# Patient Record
Sex: Female | Born: 1980 | Race: Black or African American | Hispanic: No | Marital: Single | Smoking: Never smoker
Health system: Southern US, Community
[De-identification: ages and names within clinical notes are randomized; demographics above are authoritative.]

## PROBLEM LIST (undated history)

## (undated) ENCOUNTER — Inpatient Hospital Stay (HOSPITAL_COMMUNITY): Payer: Self-pay

## (undated) DIAGNOSIS — I10 Essential (primary) hypertension: Secondary | ICD-10-CM

## (undated) DIAGNOSIS — D649 Anemia, unspecified: Secondary | ICD-10-CM

## (undated) DIAGNOSIS — E282 Polycystic ovarian syndrome: Secondary | ICD-10-CM

## (undated) DIAGNOSIS — Z8759 Personal history of other complications of pregnancy, childbirth and the puerperium: Secondary | ICD-10-CM

---

## 1999-03-27 ENCOUNTER — Emergency Department (HOSPITAL_COMMUNITY): Admission: EM | Admit: 1999-03-27 | Discharge: 1999-03-27 | Payer: Self-pay | Admitting: Emergency Medicine

## 1999-05-04 ENCOUNTER — Encounter: Admission: RE | Admit: 1999-05-04 | Discharge: 1999-05-04 | Payer: Self-pay | Admitting: Obstetrics

## 1999-06-20 ENCOUNTER — Encounter: Admission: RE | Admit: 1999-06-20 | Discharge: 1999-06-20 | Payer: Self-pay | Admitting: Obstetrics & Gynecology

## 1999-08-01 ENCOUNTER — Encounter: Admission: RE | Admit: 1999-08-01 | Discharge: 1999-08-01 | Payer: Self-pay | Admitting: Internal Medicine

## 2001-10-08 HISTORY — PX: LAPAROSCOPIC CHOLECYSTECTOMY: SUR755

## 2002-06-18 ENCOUNTER — Encounter: Admission: RE | Admit: 2002-06-18 | Discharge: 2002-06-18 | Payer: Self-pay | Admitting: Internal Medicine

## 2002-06-18 ENCOUNTER — Encounter: Payer: Self-pay | Admitting: Internal Medicine

## 2002-07-08 ENCOUNTER — Observation Stay (HOSPITAL_COMMUNITY): Admission: RE | Admit: 2002-07-08 | Discharge: 2002-07-09 | Payer: Self-pay | Admitting: General Surgery

## 2002-07-08 ENCOUNTER — Encounter: Payer: Self-pay | Admitting: General Surgery

## 2012-05-02 ENCOUNTER — Encounter: Payer: Self-pay | Admitting: Oncology

## 2012-05-02 NOTE — Progress Notes (Signed)
Faxed fmla papers to [employer] @ [phone]. °

## 2012-05-19 ENCOUNTER — Other Ambulatory Visit (HOSPITAL_COMMUNITY): Payer: Self-pay | Admitting: Obstetrics

## 2012-05-19 DIAGNOSIS — N979 Female infertility, unspecified: Secondary | ICD-10-CM

## 2012-05-23 ENCOUNTER — Ambulatory Visit (HOSPITAL_COMMUNITY)
Admission: RE | Admit: 2012-05-23 | Discharge: 2012-05-23 | Disposition: A | Discharge status: Home / Self Care | Payer: BC Managed Care – PPO | Source: Ambulatory Visit | Attending: Obstetrics | Admitting: Obstetrics

## 2012-05-23 DIAGNOSIS — N979 Female infertility, unspecified: Secondary | ICD-10-CM | POA: Insufficient documentation

## 2012-05-23 MED ORDER — IOHEXOL 300 MG/ML  SOLN: 5.0000 mL | Freq: Once | INTRAMUSCULAR | Status: AC | PRN

## 2012-10-22 ENCOUNTER — Encounter: Payer: Self-pay | Admitting: Oncology

## 2012-10-22 NOTE — Progress Notes (Signed)
Faxed disability paperwork to [facility] [phone].

## 2013-10-28 ENCOUNTER — Other Ambulatory Visit: Payer: Self-pay | Admitting: Obstetrics and Gynecology

## 2013-10-28 DIAGNOSIS — R928 Other abnormal and inconclusive findings on diagnostic imaging of breast: Secondary | ICD-10-CM

## 2013-11-05 ENCOUNTER — Other Ambulatory Visit: Payer: BC Managed Care – PPO

## 2014-10-08 HISTORY — PX: POLYPECTOMY: SHX149

## 2014-10-08 NOTE — L&D Delivery Note (Signed)
Delivery Note At 8:16 AM [name] non-viable and healthy female was delivered via Vaginal, Spontaneous Delivery (Presentation: ;  ).  APGAR: 0, 0; weight 13.9 oz (394 g).   Placenta status: Intact, Pathology Spontaneous.  Cord: 3 vessels with the following complications: None.  Cord pH: n/[name] Declined autopsy  Anesthesia: Epidural  Episiotomy: None Lacerations: None Suture Repair: n/[name] Est. Blood Loss (mL): 300  [place] gyn .  [facility] [place] [facility]  Janice [name] 04/26/2015, 9:44 AM

## 2014-11-17 ENCOUNTER — Encounter (HOSPITAL_COMMUNITY): Payer: Self-pay | Admitting: Emergency Medicine

## 2014-11-17 ENCOUNTER — Emergency Department (HOSPITAL_COMMUNITY)
Admission: EM | Admit: 2014-11-17 | Discharge: 2014-11-17 | Disposition: A | Discharge status: Home / Self Care | Payer: BLUE CROSS/BLUE SHIELD | Attending: Emergency Medicine | Admitting: Emergency Medicine

## 2014-11-17 DIAGNOSIS — Z79899 Other long term (current) drug therapy: Secondary | ICD-10-CM | POA: Diagnosis not present

## 2014-11-17 DIAGNOSIS — I10 Essential (primary) hypertension: Secondary | ICD-10-CM | POA: Diagnosis not present

## 2014-11-17 DIAGNOSIS — R350 Frequency of micturition: Secondary | ICD-10-CM | POA: Diagnosis not present

## 2014-11-17 HISTORY — DX: Essential (primary) hypertension: I10

## 2014-11-17 NOTE — ED Notes (Signed)
Pt states that she has been on htn meds since 2013.  States that she has been checking it with her new meter and it has been 160s systolic.  Denies any sx besides occasional fatigue.  No pain.

## 2014-11-17 NOTE — ED Provider Notes (Signed)
CSN: [id number]     Arrival date & time 11/17/14  1810 History  This chart was scribed for non-physician practitioner Oswaldo ConroyVictoria Janelly Switalski, PA-C working with Richardean Canalavid H Yao, MD by Conchita ParisNadim Abuhashem, ED Scribe. This patient was seen in WTR8/[facility] and the patient's care was started at 7:31 PM.   Chief Complaint  Patient presents with  . Hypertension   HPI  HPI Comments: Janice [name] is a 34 y.o. female with a history of HTN who presents to the Emergency Department complaining of an elevated HTN. She measured her BP it was 168/110. She checked it at work. She works in Clinical biochemistcustomer service. Pt is not stressed but has had trouble sleeping due to increased urinary frequency. She states she is borderline diabetic.Marland Kitchen. She takes methyldopa 500 mg 5 times a day, she is trying to get pregnant. No CP, SOB, hematuria, nausea, vomiting, diarrhea, abdominal pain, flank pain, HA, visual changes, peripheral edema.  Past Medical History  Diagnosis Date  . Hypertension    History reviewed. No pertinent past surgical history. History reviewed. No pertinent family history. History  Substance Use Topics  . Smoking status: Never Smoker   . Smokeless tobacco: Not on file  . Alcohol Use: No   OB History    No data available     Review of Systems  Constitutional: Positive for fatigue. Negative for fever and chills.  Eyes: Negative for photophobia and visual disturbance.  Respiratory: Negative for chest tightness and shortness of breath.   Cardiovascular: Negative for chest pain and palpitations.  Gastrointestinal: Negative for nausea, vomiting, abdominal pain and diarrhea.  Genitourinary: Positive for frequency. Negative for hematuria and flank pain.  Neurological: Negative for weakness and headaches.    Allergies  Review of patient's allergies indicates no known allergies.  Home Medications   Prior to Admission medications   Medication Sig Start Date End Date Taking? Authorizing Provider  acetaminophen-codeine  (TYLENOL #3) 300-30 MG per tablet Take 1 tablet by mouth every 6 (six) hours as needed for moderate pain or severe pain (pain).   Yes Historical Provider, MD  metFORMIN (GLUCOPHAGE) 1000 MG tablet Take 1,000 mg by mouth 2 (two) times daily with a meal.   Yes Historical Provider, MD  methyldopa (ALDOMET) 500 MG tablet Take 500 mg by mouth 5 (five) times daily. Take 1 Tablet By Mouth at 7am, 10am , 12 pm, 3 pm and 8pm.   Yes Historical Provider, MD  Nafarelin Acetate 2 MG/ML SOLN Place 1 mL into both nostrils 2 (two) times daily.   Yes Historical Provider, MD  Prenatal Vit-Fe Fumarate-FA (PRENATAL PO) Take 1 tablet by mouth daily.   Yes Historical Provider, MD   BP 131/99 mmHg  Pulse 80  Temp(Src) 98.8 F (37.1 C) (Oral)  Resp 20  SpO2 100% Physical Exam  Constitutional: She appears well-developed and well-nourished. No distress.  HENT:  Head: Normocephalic and atraumatic.  Eyes: Conjunctivae and EOM are normal. Right eye exhibits no discharge. Left eye exhibits no discharge.  Cardiovascular: Normal rate, regular rhythm and normal heart sounds.   Pulmonary/Chest: Effort normal and breath sounds normal. No respiratory distress. She has no wheezes.  Abdominal: Soft. Bowel sounds are normal. She exhibits no distension. There is no tenderness.  Neurological: She is alert. She exhibits normal muscle tone. Coordination normal.  Skin: Skin is warm and dry. She is not diaphoretic.  Nursing note and vitals reviewed.   ED Course  Procedures  DIAGNOSTIC STUDIES: Oxygen Saturation is 97% on room air,  normal by my interpretation.    COORDINATION OF CARE: 7:35 PM Discussed treatment plan with pt at bedside and pt agreed to plan.  Labs Review Labs Reviewed - No data to display  Imaging Review No results found.   EKG Interpretation None      MDM   Final diagnoses:  Essential hypertension   Patient presenting with elevated blood pressure without any chest pain, shortness of breath,  hematuria, visual changes, headache. No evidence of hypertensive urgency. Blood pressure in ED 131/99. Normal pulse. Patient also complaining of urinary frequency. She was offered to have CBG but refused. She stated she has had this done recently. Discussed with patient the need for close follow-up and management by their primary care physician. Patient appears reliable for follow-up and without further questions.  Discussed return precautions with patient. Discussed all results and patient verbalizes understanding and agrees with plan.  I personally performed the services described in this documentation, which was scribed in my presence. The recorded information has been reviewed and is accurate.    Louann Sjogren, PA-C 11/17/14 1943  Richardean Canal, MD 11/18/14 [phone]

## 2014-11-17 NOTE — Discharge Instructions (Signed)
Return to the emergency room with worsening of symptoms, new symptoms or with symptoms that are concerning, chest pain, shortness of breath, blood in your urine, visual changes, severe headache, leg swelling. Please call your doctor for a followup appointment within 24-48 hours. When you talk to your doctor please let them know that you were seen in the emergency department and have them acquire all of your records so that they can discuss the findings with you and formulate a treatment plan to fully care for your new and ongoing problems. If you do not have a primary care provider please call the number above, the Wellness center to establish care with a provider and follow up.  Read below information and follow recommendations.   Hypertension Hypertension, commonly called high blood pressure, is when the force of blood pumping through your arteries is too strong. Your arteries are the blood vessels that carry blood from your heart throughout your body. A blood pressure reading consists of a higher number over a lower number, such as 110/72. The higher number (systolic) is the pressure inside your arteries when your heart pumps. The lower number (diastolic) is the pressure inside your arteries when your heart relaxes. Ideally you want your blood pressure below 120/80. Hypertension forces your heart to work harder to pump blood. Your arteries may become narrow or stiff. Having hypertension puts you at risk for heart disease, stroke, and other problems.  RISK FACTORS Some risk factors for high blood pressure are controllable. Others are not.  Risk factors you cannot control include:   Race. You may be at higher risk if you are African American.  Age. Risk increases with age.  Gender. Men are at higher risk than women before age 39 years. After age 68, women are at higher risk than men. Risk factors you can control include:  Not getting enough exercise or physical activity.  Being  overweight.  Getting too much fat, sugar, calories, or salt in your diet.  Drinking too much alcohol. SIGNS AND SYMPTOMS Hypertension does not usually cause signs or symptoms. Extremely high blood pressure (hypertensive crisis) may cause headache, anxiety, shortness of breath, and nosebleed. DIAGNOSIS  To check if you have hypertension, your health care provider will measure your blood pressure while you are seated, with your arm held at the level of your heart. It should be measured at least twice using the same arm. Certain conditions can cause a difference in blood pressure between your right and left arms. A blood pressure reading that is higher than normal on one occasion does not mean that you need treatment. If one blood pressure reading is high, ask your health care provider about having it checked again. TREATMENT  Treating high blood pressure includes making lifestyle changes and possibly taking medicine. Living a healthy lifestyle can help lower high blood pressure. You may need to change some of your habits. Lifestyle changes may include:  Following the DASH diet. This diet is high in fruits, vegetables, and whole grains. It is low in salt, red meat, and added sugars.  Getting at least 2 hours of brisk physical activity every week.  Losing weight if necessary.  Not smoking.  Limiting alcoholic beverages.  Learning ways to reduce stress. If lifestyle changes are not enough to get your blood pressure under control, your health care provider may prescribe medicine. You may need to take more than one. Work closely with your health care provider to understand the risks and benefits. HOME CARE INSTRUCTIONS  Have your blood pressure rechecked as directed by your health care provider.   Take medicines only as directed by your health care provider. Follow the directions carefully. Blood pressure medicines must be taken as prescribed. The medicine does not work as well when you skip  doses. Skipping doses also puts you at risk for problems.   Do not smoke.   Monitor your blood pressure at home as directed by your health care provider. SEEK MEDICAL CARE IF:   You think you are having a reaction to medicines taken.  You have recurrent headaches or feel dizzy.  You have swelling in your ankles.  You have trouble with your vision. SEEK IMMEDIATE MEDICAL CARE IF:  You develop a severe headache or confusion.  You have unusual weakness, numbness, or feel faint.  You have severe chest or abdominal pain.  You vomit repeatedly.  You have trouble breathing. MAKE SURE YOU:   Understand these instructions.  Will watch your condition.  Will get help right away if you are not doing well or get worse. Document Released: 09/24/2005 Document Revised: 02/08/2014 Document Reviewed: 07/17/2013 [facility] Patient Information 2015 [place]. This information is not intended to replace advice given to you by your health care provider. Make sure you discuss any questions you have with your health care provider.

## 2015-01-24 LAB — OB RESULTS CONSOLE RUBELLA ANTIBODY, IGM: Rubella: IMMUNE

## 2015-01-24 LAB — OB RESULTS CONSOLE RPR: RPR: NONREACTIVE

## 2015-01-24 LAB — OB RESULTS CONSOLE HIV ANTIBODY (ROUTINE TESTING): HIV: NONREACTIVE

## 2015-01-24 LAB — OB RESULTS CONSOLE HEPATITIS B SURFACE ANTIGEN: HEP B S AG: NEGATIVE

## 2015-02-01 LAB — OB RESULTS CONSOLE GC/CHLAMYDIA
Chlamydia: NEGATIVE
Gonorrhea: NEGATIVE

## 2015-04-15 ENCOUNTER — Encounter (HOSPITAL_COMMUNITY): Payer: Self-pay | Admitting: *Deleted

## 2015-04-15 ENCOUNTER — Inpatient Hospital Stay (HOSPITAL_COMMUNITY): Payer: BLUE CROSS/BLUE SHIELD

## 2015-04-15 ENCOUNTER — Inpatient Hospital Stay (HOSPITAL_COMMUNITY)
Admission: AD | Admit: 2015-04-15 | Discharge: 2015-04-27 | DRG: 774 | Disposition: A | Discharge status: Home / Self Care | Payer: BLUE CROSS/BLUE SHIELD | Source: Ambulatory Visit | Attending: Obstetrics and Gynecology | Admitting: Obstetrics and Gynecology

## 2015-04-15 DIAGNOSIS — O1092 Unspecified pre-existing hypertension complicating childbirth: Secondary | ICD-10-CM | POA: Diagnosis present

## 2015-04-15 DIAGNOSIS — O09812 Supervision of pregnancy resulting from assisted reproductive technology, second trimester: Secondary | ICD-10-CM

## 2015-04-15 DIAGNOSIS — N898 Other specified noninflammatory disorders of vagina: Secondary | ICD-10-CM | POA: Diagnosis present

## 2015-04-15 DIAGNOSIS — E282 Polycystic ovarian syndrome: Secondary | ICD-10-CM | POA: Diagnosis present

## 2015-04-15 DIAGNOSIS — Z86010 Personal history of colonic polyps: Secondary | ICD-10-CM

## 2015-04-15 DIAGNOSIS — Z6841 Body Mass Index (BMI) 40.0 and over, adult: Secondary | ICD-10-CM | POA: Diagnosis not present

## 2015-04-15 DIAGNOSIS — O4692 Antepartum hemorrhage, unspecified, second trimester: Secondary | ICD-10-CM | POA: Diagnosis not present

## 2015-04-15 DIAGNOSIS — Z3490 Encounter for supervision of normal pregnancy, unspecified, unspecified trimester: Secondary | ICD-10-CM

## 2015-04-15 DIAGNOSIS — O09512 Supervision of elderly primigravida, second trimester: Secondary | ICD-10-CM | POA: Diagnosis present

## 2015-04-15 DIAGNOSIS — O329XX0 Maternal care for malpresentation of fetus, unspecified, not applicable or unspecified: Secondary | ICD-10-CM | POA: Diagnosis present

## 2015-04-15 DIAGNOSIS — Z3A22 22 weeks gestation of pregnancy: Secondary | ICD-10-CM | POA: Insufficient documentation

## 2015-04-15 DIAGNOSIS — O99214 Obesity complicating childbirth: Secondary | ICD-10-CM | POA: Diagnosis present

## 2015-04-15 DIAGNOSIS — O09522 Supervision of elderly multigravida, second trimester: Secondary | ICD-10-CM

## 2015-04-15 DIAGNOSIS — O09529 Supervision of elderly multigravida, unspecified trimester: Secondary | ICD-10-CM | POA: Insufficient documentation

## 2015-04-15 DIAGNOSIS — O99012 Anemia complicating pregnancy, second trimester: Secondary | ICD-10-CM | POA: Diagnosis present

## 2015-04-15 DIAGNOSIS — Z3A21 21 weeks gestation of pregnancy: Secondary | ICD-10-CM | POA: Diagnosis present

## 2015-04-15 DIAGNOSIS — O3432 Maternal care for cervical incompetence, second trimester: Secondary | ICD-10-CM | POA: Diagnosis present

## 2015-04-15 DIAGNOSIS — O26879 Cervical shortening, unspecified trimester: Secondary | ICD-10-CM

## 2015-04-15 DIAGNOSIS — N883 Incompetence of cervix uteri: Secondary | ICD-10-CM | POA: Insufficient documentation

## 2015-04-15 HISTORY — DX: Polycystic ovarian syndrome: E28.2

## 2015-04-15 LAB — CBC
HEMATOCRIT: 32.9 % — AB (ref 36.0–46.0)
HEMOGLOBIN: 10.7 g/dL — AB (ref 12.0–15.0)
MCH: 28.1 pg (ref 26.0–34.0)
MCHC: 32.5 g/dL (ref 30.0–36.0)
MCV: 86.4 fL (ref 78.0–100.0)
Platelets: 237 10*3/uL (ref 150–400)
RBC: 3.81 MIL/uL — ABNORMAL LOW (ref 3.87–5.11)
RDW: 13.6 % (ref 11.5–15.5)
WBC: 8.6 10*3/uL (ref 4.0–10.5)

## 2015-04-15 LAB — ABO/RH: ABO/RH(D): O POS

## 2015-04-15 MED ORDER — DOCUSATE SODIUM 100 MG PO CAPS
100.0000 mg | ORAL_CAPSULE | Freq: Every day | ORAL | Status: DC
  Administered 2015-04-15 – 2015-04-25 (×11): 100 mg via ORAL
  Filled 2015-04-15 (×11): qty 1

## 2015-04-15 MED ORDER — ZOLPIDEM TARTRATE 5 MG PO TABS
5.0000 mg | ORAL_TABLET | Freq: Every evening | ORAL | Status: DC | PRN
  Administered 2015-04-15: 5 mg via ORAL
  Filled 2015-04-15 (×2): qty 1

## 2015-04-15 MED ORDER — INDOMETHACIN 50 MG PO CAPS
50.0000 mg | ORAL_CAPSULE | Freq: Once | ORAL | Status: AC
  Administered 2015-04-15: 50 mg via ORAL
  Filled 2015-04-15: qty 1

## 2015-04-15 MED ORDER — METHYLDOPA 500 MG PO TABS
500.0000 mg | ORAL_TABLET | Freq: Three times a day (TID) | ORAL | Status: DC
  Administered 2015-04-15: 500 mg via ORAL
  Filled 2015-04-15 (×3): qty 1

## 2015-04-15 MED ORDER — ACETAMINOPHEN 325 MG PO TABS
650.0000 mg | ORAL_TABLET | ORAL | Status: DC | PRN
  Administered 2015-04-18: 650 mg via ORAL
  Filled 2015-04-15: qty 2

## 2015-04-15 MED ORDER — CALCIUM CARBONATE ANTACID 500 MG PO CHEW: 2.0000 | CHEWABLE_TABLET | ORAL | Status: DC | PRN

## 2015-04-15 MED ORDER — INDOMETHACIN 25 MG PO CAPS
25.0000 mg | ORAL_CAPSULE | Freq: Four times a day (QID) | ORAL | Status: DC
Start: 2015-04-16 — End: 2015-04-16
  Administered 2015-04-15 – 2015-04-16 (×2): 25 mg via ORAL
  Filled 2015-04-15 (×3): qty 1

## 2015-04-15 MED ORDER — METHYLDOPA 500 MG PO TABS
1000.0000 mg | ORAL_TABLET | Freq: Three times a day (TID) | ORAL | Status: DC
  Administered 2015-04-15 – 2015-04-25 (×31): 1000 mg via ORAL
  Filled 2015-04-15 (×33): qty 2

## 2015-04-15 MED ORDER — PRENATAL MULTIVITAMIN CH
1.0000 | ORAL_TABLET | Freq: Every day | ORAL | Status: DC
  Administered 2015-04-15 – 2015-04-25 (×11): 1 via ORAL
  Filled 2015-04-15 (×10): qty 1

## 2015-04-15 MED ORDER — METHYLDOPA 500 MG PO TABS
500.0000 mg | ORAL_TABLET | Freq: Once | ORAL | Status: AC
  Administered 2015-04-15: 500 mg via ORAL
  Filled 2015-04-15: qty 1

## 2015-04-15 MED ORDER — LACTATED RINGERS IV SOLN
INTRAVENOUS | Status: DC
  Administered 2015-04-15 – 2015-04-16 (×3): via INTRAVENOUS

## 2015-04-15 MED ORDER — METFORMIN HCL 500 MG PO TABS
1000.0000 mg | ORAL_TABLET | Freq: Two times a day (BID) | ORAL | Status: DC
  Administered 2015-04-15 – 2015-04-26 (×22): 1000 mg via ORAL
  Filled 2015-04-15 (×23): qty 2

## 2015-04-15 NOTE — Anesthesia Preprocedure Evaluation (Signed)
Anesthesia Evaluation  Patient identified by MRN, date of birth, ID band Patient awake    Reviewed: Allergy & Precautions, NPO status , Patient's Chart, lab work & pertinent test results  Airway Mallampati: III  TM Distance: >3 FB Neck ROM: Full    Dental no notable dental hx. (+) Teeth Intact   Pulmonary neg pulmonary ROS,  breath sounds clear to auscultation  Pulmonary exam normal       Cardiovascular hypertension, Pt. on medications Normal cardiovascular examRhythm:Regular Rate:Normal     Neuro/Psych negative neurological ROS  negative psych ROS   GI/Hepatic negative GI ROS, Neg liver ROS,   Endo/Other  Morbid obesity  Renal/GU negative Renal ROS  negative genitourinary   Musculoskeletal negative musculoskeletal ROS (+)   Abdominal (+) + obese,   Peds  Hematology  (+) anemia ,   Anesthesia Other Findings   Reproductive/Obstetrics (+) Pregnancy [redacted] weeks gestation Bulging membranes\ Incompetent Cervix                             Anesthesia Physical Anesthesia Plan  ASA: III  Anesthesia Plan: Spinal   Post-op Pain Management:    Induction:   Airway Management Planned: Natural Airway  Additional Equipment:   Intra-op Plan:   Post-operative Plan:   Informed Consent: I have reviewed the patients History and Physical, chart, labs and discussed the procedure including the risks, benefits and alternatives for the proposed anesthesia with the patient or authorized representative who has indicated his/her understanding and acceptance.     Plan Discussed with: Anesthesiologist, Surgeon and CRNA  Anesthesia Plan Comments:         Anesthesia Quick Evaluation

## 2015-04-15 NOTE — Progress Notes (Signed)
Met w/ pt to discuss further plan. Since admission, MFM consult done, no measurable cvx on u/s and discussion of R/B of cerclage.  D/w pt high risks of cerclage in current setting, risks of SROM, cervical damage, chorioamnionitis and no guarantee of getting to viability. If by am, membranes have regressed, pt would like to proceed with cerclage despite these risks. Plan Indocin for now. Defer abx. If in am, membranes more prolapsed, plan bedside SSE, or pt w/ any signs of infection, will defer cerclage and plan expectant management. Pt aware high risk of SROM and pre-viable delivery in setting of expectant management.  Janice Decou A. 04/15/2015 6:51 PM

## 2015-04-15 NOTE — Consult Note (Signed)
Maternal Fetal Medicine Consultation  Requesting Provider(s): Janice FordyceKelly Fogleman, MD  Reason for consultation: Cervical insufficiency  HPI: Janice [name] is a 34 yo G1P0, EDD 08/26/2015 who is currently at 21w 0d who is currently admitted due to suspected cervical insufficiency and possible cerclage placement tomorrow.  The patient was seen earlier today in clinic - noted to have no measurable cervix by ultrasound with the cervix dilated to approximately 2 cm with membranes visible at the external os.  On digital exam, there was some question of prolapsing membranes.  Ms. Janice [name] reports increased vaginal discharge over the last several days, but denies vaginal bleeding, leakage of fluid or contractions.  This is an IVF pregnancy - noted to have a vanishing twin in early pregnancy.  The patient has a history of PCOS and chronic hypertension on Methyldopa.  Prenatal course is otherwise uncomplicated.  OB History: OB History    Gravida Para Term Preterm AB TAB SAB Ectopic Multiple Living   1               PMH:  Past Medical History  Diagnosis Date  . Hypertension     takes methyldopa  . PCOS (polycystic ovarian syndrome)     PSH:  Past Surgical History  Procedure Laterality Date  . Laparoscopic cholecystectomy  2003  . Polypectomy  2016   Meds:  No current facility-administered medications on file prior to encounter.   Current Outpatient Prescriptions on File Prior to Encounter  Medication Sig Dispense Refill  . metFORMIN (GLUCOPHAGE) 1000 MG tablet Take 1,000 mg by mouth 2 (two) times daily with a meal.    . methyldopa (ALDOMET) 500 MG tablet Take 1,000 mg by mouth 3 (three) times daily.     . Prenatal Vit-Fe Fumarate-FA (PRENATAL PO) Take 1 tablet by mouth daily.     Allergies: No Known Allergies   FH: History reviewed. No pertinent family history.   Soc: History   Social History  . Marital Status: Single    Spouse Name: N/A  . Number of Children: N/A  . Years of  Education: N/A   Occupational History  . Not on file.   Social History Main Topics  . Smoking status: Never Smoker   . Smokeless tobacco: Never Used  . Alcohol Use: No  . Drug Use: No  . Sexual Activity: Yes   Other Topics Concern  . Not on file   Social History Narrative    PE:   Filed Vitals:   04/15/15 1626  BP: 127/77  Pulse: 88  Temp: 98.4 F (36.9 C)  Resp: 18    GEN: well-appearing female ABD: gravid, NT  Ultrasound: Single IUP at 21w 0d Cervical insufficiency Limited views of the fetal face and heart obtained The estimated fetal weight today is at the 31st %tile. Posterior placenta Normal amniotic fluid volume  No measurable cervix is appreciated.  The cervix appears to be dilated (2-3 cm ).  The fetal lower extremities are at the cervix. Fetal membranes appear to be hour-glassing into the vagina  Labs: CBC    Component Value Date/Time   WBC 8.6 04/15/2015 1212   RBC 3.81* 04/15/2015 1212   HGB 10.7* 04/15/2015 1212   HCT 32.9* 04/15/2015 1212   PLT 237 04/15/2015 1212   MCV 86.4 04/15/2015 1212   MCH 28.1 04/15/2015 1212   MCHC 32.5 04/15/2015 1212   RDW 13.6 04/15/2015 1212     A/P:  1) Single IUP at 21w 0d  2) Cervical  insufficiency - Ultrasound findings and limitations of the study were discussed with the patient and her husband.  Based on the extent of cervical dilation with fetal membranes prolapsing into the vagina do not feel that the patient is a very good candidate for rescue cerclage at this time both from a technical stand point and due to risk of infection. Would consider repeating a sterile speculum exam in the morning - if the fetal membranes retract back into the uterus and if the patient does not have leukocytosis or s/sx of intra-amniotic infection would entertain cerclage at that time, but feel that the chance this will happen is very unlikely.  Feel that the safest course of action is for continued expectant management.  Would  observe as an inpatient for 48-72 hours - feel there is a strong likelihood that she will experience PROM or possible delivery during that time frame.  If the patient remains stable over the period of observation, would consider hospital discharge with close outpatient follow up to include at least weekly clinic visits.  Would readmit at 23 weeks for a course of betamethasone and latency antibiotics.  Findings and recommendations were discussed with Dr. Ernestina [name].   Thank you for the opportunity to be a part of the care of Janice [name]. Please contact our office if we can be of further assistance.   I spent approximately 30 minutes with this patient with over 50% of time spent in face-to-face counseling.  Alpha Gula, MD Maternal Fetal Medicine

## 2015-04-15 NOTE — H&P (Signed)
Janice [name] is a 34 y.o. G1P0 at [redacted]w[redacted]d presenting for cervical incompetence. Pt notes no contractions but does note some increased pelvic pressure with 'pulsations' over the last few days.  Good fetal movement, No vaginal bleeding.  [facility] 7 wks - Dated by IVF and early u/s, [facility] 08/25/16 - IVF pregnancy - chronic htn, on methyldopa 500 tid, baby ASA, nl baseline PEC labs and 24 hr urine - AMA. Increase AFP at 99% and NT calculation at 1/200 risk for DS (similar to age alone). NT labs redrawn as first draw was w/in 2 wks of vanishing twin and repeat NT calculation normal.  - obesity - PCOS, on metformin 1000 bid prior to preg. Continued until 12 wks, Early DS, off metformin 132 and metformin restarted.   Prenatal Transfer Tool  Maternal Diabetes: No Genetic Screening: Normal Maternal Ultrasounds/Referrals: Abnormal:  Findings:   Other: awaiting cardiac images Fetal Ultrasounds or other Referrals:  Referred to Materal Fetal Medicine  Maternal Substance Abuse:  No Significant Maternal Medications:  Metformin, methyldopa Significant Maternal Lab Results: NT as above     OB History    Gravida Para Term Preterm AB TAB SAB Ectopic Multiple Living   1              Past Medical History  Diagnosis Date  . Hypertension     takes methyldopa  . PCOS (polycystic ovarian syndrome)    Past Surgical History  Procedure Laterality Date  . Laparoscopic cholecystectomy  2003  . Polypectomy  2016   Family History: family history is not on file. Social History:  reports that she has never smoked. She has never used smokeless tobacco. She reports that she does not drink alcohol or use illicit drugs.  Review of Systems - Negative except pelvic pressure     Blood pressure 118/72, pulse 106, temperature 98.2 F (36.8 C), temperature source Oral, resp. rate 18, height  (1.626 m), weight 108.863 kg (240 lb), SpO2 100 %.  Physical Exam:  Gen: well appearing, no  distress  Back: no CVAT Abd: gravid, NT, no RUQ pain LE: no edema, equal bilaterally, non-tender Toco: pending [id number] doppler FH Gu: speculum exam with 2 cm cervical dilation with clear membranes visible 0.5 cm proximal to leading cervical edge. Vaginal exam done and with patient spontaneous valsalva, membranes prolapsed to beyond cervical edge  U/s: no measurable cvx, exam stopped and pt transferred to hospital.  Prenatal labs: ABO, Rh: --/--/O NEG (07/08 1212) Antibody: NEG (07/08 1212) Rubella:  immune RPR:   NR HBsAg:   neg HIV:   neg GBS:   not done  1 hr Glucola too early  Genetic screening normal Anatomy US cardiac images pending, other images normal   Assessment/Plan: 34 y.o. G1P0 at [redacted]w[redacted]d Cervical incompetence, new diagnosis today. Cvx 5 cm at 16 wks (done for h/o IVF preg). Some dynamic changes 2.5 to 4.5 cm at 19 wks, now no measurable cervix. Findings d/w pt and options of expectant management with high possibility of pre-viable ROM and delivery vs hospital observation in [place] vs rescue cerclage d/w pt. Pt aware rescue cerclage is not guarantee of latency until viability, risks of PTD, risks of SROM or PTL with attempted cerclage and infectious risk of cerclage. Pt wishes for aggressive management despite risks. Will watch o/n and if no signs of infection or SROM will likely plan for cerclage in am. MFM consult pending.  Janice Much A. 04/15/2015 1:23 PM  Janice Sofia A. 04/15/2015, 1:13 PM

## 2015-04-15 NOTE — Progress Notes (Signed)
RN notified MD of pt's report that her usual dose for methlydopa is 1000 mg TID. Per MD, RN to modify current order of methyldopa 500 mg TID to 1000 mg TID.

## 2015-04-16 ENCOUNTER — Inpatient Hospital Stay (HOSPITAL_COMMUNITY): Payer: BLUE CROSS/BLUE SHIELD | Admitting: Anesthesiology

## 2015-04-16 ENCOUNTER — Encounter (HOSPITAL_COMMUNITY): Payer: Self-pay

## 2015-04-16 ENCOUNTER — Encounter (HOSPITAL_COMMUNITY)
Admission: AD | Disposition: A | Discharge status: Home / Self Care | Payer: Self-pay | Source: Ambulatory Visit | Attending: Obstetrics

## 2015-04-16 HISTORY — PX: CERVICAL CERCLAGE: SHX1329

## 2015-04-16 LAB — CBC WITH DIFFERENTIAL/PLATELET
BASOS ABS: 0 10*3/uL (ref 0.0–0.1)
Basophils Relative: 0 % (ref 0–1)
Eosinophils Absolute: 0.1 10*3/uL (ref 0.0–0.7)
Eosinophils Relative: 1 % (ref 0–5)
HEMATOCRIT: 30.6 % — AB (ref 36.0–46.0)
Hemoglobin: 9.9 g/dL — ABNORMAL LOW (ref 12.0–15.0)
LYMPHS PCT: 32 % (ref 12–46)
Lymphs Abs: 2.5 10*3/uL (ref 0.7–4.0)
MCH: 27.7 pg (ref 26.0–34.0)
MCHC: 32.4 g/dL (ref 30.0–36.0)
MCV: 85.5 fL (ref 78.0–100.0)
MONO ABS: 0.8 10*3/uL (ref 0.1–1.0)
Monocytes Relative: 10 % (ref 3–12)
Neutro Abs: 4.5 10*3/uL (ref 1.7–7.7)
Neutrophils Relative %: 57 % (ref 43–77)
Platelets: 234 10*3/uL (ref 150–400)
RBC: 3.58 MIL/uL — ABNORMAL LOW (ref 3.87–5.11)
RDW: 13.3 % (ref 11.5–15.5)
WBC: 8 10*3/uL (ref 4.0–10.5)

## 2015-04-16 LAB — TYPE AND SCREEN
ABO/RH(D): O POS
Antibody Screen: NEGATIVE

## 2015-04-16 SURGERY — CERCLAGE, CERVIX, VAGINAL APPROACH
Anesthesia: Spinal | Site: Vagina

## 2015-04-16 MED ORDER — BUPIVACAINE IN DEXTROSE 0.75-8.25 % IT SOLN
INTRATHECAL | Status: DC | PRN
  Administered 2015-04-16: 9 mg via INTRATHECAL

## 2015-04-16 MED ORDER — MEPERIDINE HCL 25 MG/ML IJ SOLN: 6.2500 mg | INTRAMUSCULAR | Status: DC | PRN

## 2015-04-16 MED ORDER — PROMETHAZINE HCL 25 MG/ML IJ SOLN: 6.2500 mg | INTRAMUSCULAR | Status: DC | PRN

## 2015-04-16 MED ORDER — ASPIRIN 81 MG PO CHEW
81.0000 mg | CHEWABLE_TABLET | Freq: Every day | ORAL | Status: DC
  Administered 2015-04-16 – 2015-04-25 (×10): 81 mg via ORAL
  Filled 2015-04-16 (×11): qty 1

## 2015-04-16 MED ORDER — CITRIC ACID-SODIUM CITRATE 334-500 MG/5ML PO SOLN
ORAL | Status: AC
  Administered 2015-04-16: 30 mL
  Filled 2015-04-16: qty 15

## 2015-04-16 MED ORDER — PANTOPRAZOLE SODIUM 40 MG PO TBEC
40.0000 mg | DELAYED_RELEASE_TABLET | Freq: Every day | ORAL | Status: DC
  Administered 2015-04-16 – 2015-04-25 (×10): 40 mg via ORAL
  Filled 2015-04-16 (×10): qty 1

## 2015-04-16 MED ORDER — INDOMETHACIN 25 MG PO CAPS
25.0000 mg | ORAL_CAPSULE | Freq: Four times a day (QID) | ORAL | Status: AC
  Administered 2015-04-16 – 2015-04-18 (×7): 25 mg via ORAL
  Filled 2015-04-16 (×7): qty 1

## 2015-04-16 MED ORDER — HYDROXYPROGESTERONE CAPROATE 250 MG/ML IM OIL
250.0000 mg | TOPICAL_OIL | INTRAMUSCULAR | Status: DC
  Administered 2015-04-16 – 2015-04-23 (×2): 250 mg via INTRAMUSCULAR
  Filled 2015-04-16 (×2): qty 1

## 2015-04-16 MED ORDER — LACTATED RINGERS IV SOLN
INTRAVENOUS | Status: DC | PRN
  Administered 2015-04-16 (×2): via INTRAVENOUS

## 2015-04-16 MED ORDER — FENTANYL CITRATE (PF) 100 MCG/2ML IJ SOLN: 25.0000 ug | INTRAMUSCULAR | Status: DC | PRN

## 2015-04-16 SURGICAL SUPPLY — 19 items
CLOTH BEACON ORANGE TIMEOUT ST (SAFETY) ×3 IMPLANT
COUNTER NEEDLE 1200 MAGNETIC (NEEDLE) IMPLANT
GLOVE BIOGEL M 6.5 STRL (GLOVE) ×6 IMPLANT
GLOVE BIOGEL PI IND STRL 7.0 (GLOVE) ×1 IMPLANT
GLOVE BIOGEL PI INDICATOR 7.0 (GLOVE) ×2
GOWN STRL REUS W/TWL LRG LVL3 (GOWN DISPOSABLE) ×6 IMPLANT
NEEDLE MAYO .5 CIRCLE (NEEDLE) ×3 IMPLANT
PACK VAGINAL MINOR WOMEN LF (CUSTOM PROCEDURE TRAY) ×3 IMPLANT
PAD OB MATERNITY 4.3X12.25 (PERSONAL CARE ITEMS) ×3 IMPLANT
PAD PREP 24X48 CUFFED NSTRL (MISCELLANEOUS) ×3 IMPLANT
SUT ETHIBOND  5 (SUTURE) ×2
SUT ETHIBOND 5 (SUTURE) ×1 IMPLANT
SUT PROLENE 0 CT 1 30 (SUTURE) ×3 IMPLANT
TOWEL OR 17X24 6PK STRL BLUE (TOWEL DISPOSABLE) ×6 IMPLANT
TRAY FOLEY CATH SILVER 14FR (SET/KITS/TRAYS/PACK) ×3 IMPLANT
TUBING NON-CON 1/4 X 20 CONN (TUBING) IMPLANT
TUBING NON-CON 1/4 X 20' CONN (TUBING)
WATER STERILE IRR 1000ML POUR (IV SOLUTION) ×3 IMPLANT
YANKAUER SUCT BULB TIP NO VENT (SUCTIONS) IMPLANT

## 2015-04-16 NOTE — Brief Op Note (Signed)
04/15/2015 - 04/16/2015  8:49 AM  PATIENT:  Janice [name]  34 y.o. female  PRE-OPERATIVE DIAGNOSIS:  bulging membranes, cervical incompetence  POST-OPERATIVE DIAGNOSIS:  bulging membranes, cervical incompetence  PROCEDURE:  Procedure(s): CERCLAGE CERVICAL (N/A)  SURGEON:  Surgeon(s) and Role:    * Noland FordyceKelly Keithon Mccoin, MD - Primary  PHYSICIAN ASSISTANT:   ASSISTANTS: none   ANESTHESIA:   spinal  EBL:  Total I/O In: -  Out: 305 [Urine:300; Blood:5]  BLOOD ADMINISTERED:none  DRAINS: Urinary Catheter (Foley)   LOCAL MEDICATIONS USED:  NONE  SPECIMEN:  No Specimen  DISPOSITION OF SPECIMEN:  N/A  COUNTS:  YES  TOURNIQUET:  * No tourniquets in log *  DICTATION: .Note written in EPIC  PLAN OF CARE: Admit to inpatient   PATIENT DISPOSITION:  PACU - hemodynamically stable.   Delay start of Pharmacological VTE agent (>24hrs) due to surgical blood loss or risk of bleeding: yes

## 2015-04-16 NOTE — Anesthesia Postprocedure Evaluation (Signed)
Anesthesia Post Note  Patient: Janice GallusKendra R Vanmaanen  Procedure(s) Performed: Procedure(s) (LRB): CERCLAGE CERVICAL (N/A)  Anesthesia type: Spinal  Patient location: PACU  Post pain: Pain level controlled  Post assessment: Post-op Vital signs reviewed  Last Vitals:  Filed Vitals:   04/16/15 1000  BP: 117/84  Pulse: 66  Temp: 36.4 C  Resp: 24    Post vital signs: Reviewed  Level of consciousness: awake  Complications: No apparent anesthesia complications

## 2015-04-16 NOTE — Transfer of Care (Signed)
Immediate Anesthesia Transfer of Care Note  Patient: Janice GallusKendra R Kaatz  Procedure(s) Performed: Procedure(s): CERCLAGE CERVICAL (N/A)  Patient Location: PACU  Anesthesia Type:Spinal  Level of Consciousness: awake, alert  and oriented  Airway & Oxygen Therapy: Patient Spontanous Breathing  Post-op Assessment: Report given to RN and Post -op Vital signs reviewed and stable  Post vital signs: Reviewed and stable  Last Vitals:  Filed Vitals:   04/16/15 0854  BP:   Pulse: 74  Temp: 36.6 C  Resp:     Complications: No apparent anesthesia complications

## 2015-04-16 NOTE — Progress Notes (Signed)
S: no bleeding, no LOF, no fevers, no contractions, no pain, no SOB. Has remained in trendelenberg position/ NPO overnight.  O: Filed Vitals:   04/16/15 0610  BP: 106/66  Pulse: 90  Temp: 98 F (36.7 C)  Resp: 20   Gen: lying in bed, no distress Abd: gravid, NT GU: SSE: cvx 2-3cm dilated, about .5 cm of cervix distal to membranes with clear fluid LE: SCD's in place, NT  CBC    Component Value Date/Time   WBC 8.0 04/16/2015 0509   RBC 3.58* 04/16/2015 0509   HGB 9.9* 04/16/2015 0509   HCT 30.6* 04/16/2015 0509   PLT 234 04/16/2015 0509   MCV 85.5 04/16/2015 0509   MCH 27.7 04/16/2015 0509   MCHC 32.4 04/16/2015 0509   RDW 13.3 04/16/2015 0509   LYMPHSABS 2.5 04/16/2015 0509   MONOABS 0.8 04/16/2015 0509   EOSABS 0.1 04/16/2015 0509   BASOSABS 0.0 04/16/2015 0509     A/P: 34 yo G1 at 21'1 by IVF preg and early u/s here with cervical incompetence. - Cervical incompetence. R/B of cerclage again reviewed w/ pt in a 30 min discussion. As membranes have retracted into cervix and pt w/o signs of infection pt continues to be a candidate for cerclage. We discussed the risks to include immediate surgical risks of bleeding, infection, ROM, anasthesia risks and longer term risks of infection which could cause SROM, labor, chorio, DIC and no guarantee that she would stay pregnant until viability. Also d/w pt cervical tearing which could furher compromise cervical integrity. We also discuss the option of no cerclage and expectant management on bedrest but that carries a high risk of SROM and pre-viable delivery. Pt woul dlike to proceed with cerclage.  Levon Penning A. 04/16/2015 7:25 AM

## 2015-04-16 NOTE — Addendum Note (Signed)
Addendum  created 04/16/15 1701 by Renford DillsJanet L Harmani Neto, CRNA   Modules edited: Notes Section   Notes Section:  File: [id number]

## 2015-04-16 NOTE — Op Note (Signed)
04/15/2015 - 04/16/2015  8:49 AM  PATIENT:  Janice [name]  34 y.o. female  PRE-OPERATIVE DIAGNOSIS:  bulging membranes, cervical incompetence  POST-OPERATIVE DIAGNOSIS:  bulging membranes, cervical incompetence  PROCEDURE:  Procedure(s): CERCLAGE CERVICAL (N/A)  SURGEON:  Surgeon(s) and Role:    * Noland Fordyce, MD - Primary  PHYSICIAN ASSISTANT:   ASSISTANTS: none   ANESTHESIA:   spinal  EBL:  Total I/O In: -  Out: 305 [Urine:300; Blood:5]  BLOOD ADMINISTERED:none  DRAINS: Urinary Catheter (Foley)   LOCAL MEDICATIONS USED:  NONE  SPECIMEN:  No Specimen  DISPOSITION OF SPECIMEN:  N/A  COUNTS:  YES  TOURNIQUET:  * No tourniquets in log *  DICTATION: .Note written in EPIC  PLAN OF CARE: Admit to inpatient   PATIENT DISPOSITION:  PACU - hemodynamically stable.   Delay start of Pharmacological VTE agent (>24hrs) due to surgical blood loss or risk of bleeding: yes  OPERATIVE REPORT   PREOPERATIVE DIAGNOSES:  1. Cervical incompetence.  2. Intrauterine gestation, at 21 weeks.   POSTOPERATIVE DIAGNOSES:  1. Cervical incompetence.  2. Intrauterine gestation at 21 weeks.   PROCEDURE: Emergency/rescue McDonald cervical cerclage.   ANESTHESIA: Spinal.   SURGEON: Lendon Colonel, M.D.  ASSISTANT: none Antibiotics: none EBL: <30cc Complications: none Findings: cervix 3 cm dilated, .5 cm cvx distal to membranes at rest   INDICATION: This is a 34 yo G1 at 21 wks with IVF pregnancy and with findings of dilated/ funelled cervix.  After review of the sonogram, discussed with pt no clear "right" decision. Options include rescue cerclage which may prevent further cervical dilation and ROM but also w/ operative risks including bleeding, infection, ROM and longer term risks including further damage to cervix if pt contracts w/ cerclage, chorio and PT loss, esp as cerclage may be nidus for infection. Alternative option is to bedrest with vaginal progesterone  supplementation. I d/w pt that both options carry risk of pre-viable delivery. We discussed that the literature/ evidence based studies do NOT support a cerclage but rather expectant management with progesterone supplementation but that in many cases physicians and patients choose to do a cerclage and that many times this is able to keep a pregnancy healthy until viability. Pt aware she will need in-house monitoring for about 1 wk and long term bedrest with a cerclage. The patient was consented for an emergency cerclage.  PROCEDURE IN DETAIL: Under adequate spinal anesthesia, the patient was  placed in the dorsal lithotomy position. She was externally sterilely  prepped and draped in usual fashion. Foley catheter was placed. Weighted speculum placed in the  vagina. Sims retractor was placed anteriorly. The visualization of the  cervix shows dilation and clear amniotic fluid within bulging membranes.Pt was placed in deep Trendelenberg position. The cervix was grasped with a ring clamp anteriorly and this moved around the cervix during the procedure to help w/ countertraction and to pull the cervix away from the membranes.  A 30 cc Foley balloon was carefully placed through the cervix and slowly distended to push the membranes further cephalad. This did not produce signigicant effect and due to the need for retraction elsewhere, the foley was then removed. With the ring forcep on the cervix and deeper Trendelenberg, about 1 cm of cervix was available for suturing.  With that done an Ethibond suture on 1/2 circle Mayo needle was then used to perform a McDonald cerclage, starting at [employer]. Four pursestring suture were thrown and tied at 12 o'clock. A  prolene tag was used under the Ethibond suture to help with identification later in the pregnancy. The cervix cinched down with the cervical os being closed. Irrigation was carried out. Hemostasis was noted. Membranes were intact. At that point, the procedure  was felt to  be complete.The cervix on digital exam post procedure was closed but effaced.  Sponge lap and needle counts were correct.  Mercades Bajaj A. 04/16/2015 8:50 AM

## 2015-04-16 NOTE — Anesthesia Postprocedure Evaluation (Signed)
  Anesthesia Post-op Note  Patient: Audrie GallusKendra R Cuen  Procedure(s) Performed: Procedure(s): CERCLAGE CERVICAL (N/A)  Patient Location: Antenatal  Anesthesia Type:Spinal  Level of Consciousness: awake  Airway and Oxygen Therapy: Patient Spontanous Breathing  Post-op Pain: mild  Post-op Assessment: Patient's Cardiovascular Status Stable and Respiratory Function Stable LLE Motor Response: Purposeful movement LLE Sensation: Increased RLE Motor Response: Purposeful movement RLE Sensation: Increased      Post-op Vital Signs: stable  Last Vitals:  Filed Vitals:   04/16/15 1356  BP: 123/71  Pulse: 96  Temp: 37.1 C  Resp: 20    Complications: No apparent anesthesia complications

## 2015-04-16 NOTE — Anesthesia Procedure Notes (Addendum)
Spinal Patient location during procedure: OR Start time: 04/16/2015 7:43 AM End time: 04/16/2015 7:56 AM Staffing Anesthesiologist: Leilani AbleHATCHETT, Elson Ulbrich Performed by: anesthesiologist  Preanesthetic Checklist Completed: patient identified, surgical consent, pre-op evaluation, timeout performed, IV checked, risks and benefits discussed and monitors and equipment checked Spinal Block Patient position: right lateral decubitus Prep: site prepped and draped and DuraPrep Patient monitoring: heart rate, cardiac monitor, continuous pulse ox and blood pressure Approach: midline Location: L3-4 Injection technique: single-shot Needle Needle type: Sprotte  Needle gauge: 24 G Needle length: 9 cm Needle insertion depth: 8 cm Assessment Sensory level: T4 Additional Notes Needed 18G Tuohy X 2 to 7cm followed by a 24G Sprotte to + CSF.

## 2015-04-17 NOTE — Progress Notes (Signed)
Patient ID: Janice GallusKendra R [name], female   DOB: May 02, 1981, 34 y.o.   MRN: [id number] POD #1 HD #2 Cervical insufficiency s/p Rescue McDonald Cerclage  S: No complaints. Denies cramping , bleeding or LOF No malodorous dc noted Good FM. No fever, chills. No CP or SOB Comfortable in Trendelenberg  O: Patient Vitals for the past 24 hrs:  BP Temp Temp src Pulse Resp  04/17/15 1119 131/84 mmHg 98.7 F (37.1 C) Oral 89 18  04/17/15 0607 121/89 mmHg 98.1 F (36.7 C) Oral 86 18  04/16/15 2128 116/78 mmHg 98.2 F (36.8 C) Oral 87 20  04/16/15 1811 (!) 121/91 mmHg 98.2 F (36.8 C) Oral 79 20  04/16/15 1356 123/71 mmHg 98.7 F (37.1 C) Oral 96 20   NCAT HEENT: nl Neck : supple Lungs: CTA, no WRR CV: RRR Abd: obese, gravid , NT No CVAT No vaginal bleeding noted  Pelvic: deferred Neuro: nonfocal Skin: intact  FHT 140-160 No contractions noted  IMP: 21w 2d IUP Cervical Insufficiency s/p Emergency cerclage. Stable on Indocin. No s/s chorio. History of IVF with vanishing twin CHTN stable on [place] History of PCOS continued on Metformin  P: Continue Indocin as ordered Monitor s/s chorio Recommend sono for CL in 24 -48 hrs Bedrest SCDs

## 2015-04-18 ENCOUNTER — Encounter (HOSPITAL_COMMUNITY): Payer: Self-pay | Admitting: Obstetrics

## 2015-04-18 MED ORDER — SODIUM CHLORIDE 0.9 % IJ SOLN
3.0000 mL | Freq: Two times a day (BID) | INTRAMUSCULAR | Status: DC
  Administered 2015-04-18 – 2015-04-22 (×10): 3 mL via INTRAVENOUS

## 2015-04-18 NOTE — Progress Notes (Signed)
HD#4, POD #2 s/p rescue McDonald cerclage  Pt notes no LOF, no foul odor, no vaginal bleeding, no contractions, feeling fetal movement, no fevers, no abdominal pain. Pt happy about her decision for cerclage, feels comfortable being in hospital and does not think she could manage at home, living upstairs with little support during the day. Pt adjusting to  Trendelenberg position and states "not that bad." Has had BM since surgery. Remains on PNV, Colace, Protonix. Started 17-P  O: Filed Vitals:   04/18/15 0800 04/18/15 1150 04/18/15 1623 04/18/15 1626  BP: 131/82 95/56 104/59   Pulse: 80 85 90 84  Temp: 98.2 F (36.8 C) 98.5 F (36.9 C) 98.6 F (37 C)   TempSrc: Oral Oral Oral   Resp: 20 20 20    Height:      Weight:      SpO2:    98%   Gen: well appearing, upbeat, no distress Abd: soft, gravid, NT GU: def LE: SCD's in place, NT  A/P: 34 yo G1 at 21'3 with IVF pregnancy and cervical incompetence s/p MacDonald rescue cerclage at 21'1 - cervical incompetence. D/w pt that while no immediate complications of cerclage, she remains at risk of recurrent cervical incompetence, SROM, PTL, PTD and chorioamnionitis. Clinical vigilance with attention to symptoms. Still remote from delivery. Will plan inhouse bed rest as degree of cervical dilation and effacement only allowed limited amount of cervix to be closed with cerclage. Her cervix in unlikely to withstand significant pressure. Continue bedrest in Trendelenburg position. Will plan u/s for cervical length 1 wk post cerclage. - fetal well being. Premature. q shift kick counts.   Isaias Dowson A. 04/18/2015 6:22 PM   Late entry, pt seen at 8 am.

## 2015-04-19 LAB — TYPE AND SCREEN
ABO/RH(D): O POS
Antibody Screen: NEGATIVE

## 2015-04-19 NOTE — Progress Notes (Signed)
HD#5, POD #3 s/p rescue McDonald cerclage  Pt notes no LOF, no foul odor, no vaginal bleeding, no contractions, feeling fetal movement, no fevers, no abdominal pain. Slight HA this am. Napping comfortably early afternoon. Has had BM since surgery. Remains on PNV, Colace, Protonix. Started 17-P  O: Filed Vitals:   04/18/15 1959 04/19/15 0603 04/19/15 0830 04/19/15 1600  BP: 132/84 115/86 136/82 107/77  Pulse: 84 95 79 89  Temp: 98.5 F (36.9 C)  98.1 F (36.7 C) 98.5 F (36.9 C)  TempSrc: Oral  Oral Oral  Resp: 20  20 18   Height:      Weight:      SpO2:       Gen: well appearing, upbeat, no distress Abd: soft, gravid, NT GU: def LE: SCD's in place, NT  A/P: 34 yo G1 at 21'4 with IVF pregnancy and cervical incompetence s/p MacDonald rescue cerclage at 21'1 - cervical incompetence. D/w pt that while no immediate complications of cerclage, she remains at risk of recurrent cervical incompetence, SROM, PTL, PTD and chorioamnionitis. Clinical vigilance with attention to symptoms. Still remote from delivery. Will plan inhouse bed rest as degree of cervical dilation and effacement only allowed limited amount of cervix to be closed with cerclage. Her cervix in unlikely to withstand significant pressure. Continue bedrest in Trendelenburg position. Will plan u/s for cervical length 1 wk post cerclage. - fetal well being. Premature. q shift kick counts.   Janice [name] A. 04/19/2015 5:06 PM   Late entry, pt seen at 2 pm.

## 2015-04-20 LAB — CBC
HCT: 30.8 % — ABNORMAL LOW (ref 36.0–46.0)
HEMOGLOBIN: 10.1 g/dL — AB (ref 12.0–15.0)
MCH: 28.1 pg (ref 26.0–34.0)
MCHC: 32.8 g/dL (ref 30.0–36.0)
MCV: 85.8 fL (ref 78.0–100.0)
PLATELETS: 233 10*3/uL (ref 150–400)
RBC: 3.59 MIL/uL — ABNORMAL LOW (ref 3.87–5.11)
RDW: 13.4 % (ref 11.5–15.5)
WBC: 9.3 10*3/uL (ref 4.0–10.5)

## 2015-04-20 LAB — TYPE AND SCREEN
ABO/RH(D): O POS
ANTIBODY SCREEN: NEGATIVE

## 2015-04-20 NOTE — Progress Notes (Signed)
HD#6, POD #4 s/p rescue McDonald cerclage  Pt notes slight increase in vaginal discharge this morning similar to the discharge she felt when she was first diagnosed with cervical incompetence. This is causing a slight degree of anxiety and patient requests examination today. Otherwise, no LOF, no foul odor, no vaginal bleeding, no contractions. She is feeling fetal movement, no fevers, no abdominal pain. Remains on PNV, Colace, Protonix. Started 17-P  O: Filed Vitals:   04/19/15 2213 04/20/15 0812 04/20/15 1223 04/20/15 1604  BP: 118/74 119/82 125/70 121/77  Pulse: 84 79 77 93  Temp:  98.6 F (37 C) 98.4 F (36.9 C) 98.3 F (36.8 C)  TempSrc:  Oral Oral Oral  Resp:  18 18 18   Height:      Weight:      SpO2:       Gen: well appearing, upbeat, no distress Abd: soft, gravid, NT Sterile speculum exam: Cerclage in place, suture visualize, cervix visually closed, small mucus at cervical os, no blood, no fluid in the vagina, no prolapsing membranes. Of note during manipulation of the speculum a small amount of bleeding started from the 3:00 position of the lateral external cervical wall. LE: SCD's in place, NT  A/P: 34 yo G1 at 21'5 with IVF pregnancy and cervical incompetence s/p MacDonald rescue cerclage at 21'1 - cervical incompetence. D/w pt that while no immediate complications of cerclage, she remains at risk of recurrent cervical incompetence, SROM, PTL, PTD and chorioamnionitis. Clinical vigilance with attention to symptoms- benign exam today. Given patient's concern will repeat CBC today to evaluate for trend of white blood cell count.  Still remote from delivery. Will plan inhouse bed rest as degree of cervical dilation and effacement only allowed limited amount of cervix to be closed with cerclage. Her cervix in unlikely to withstand significant pressure. Continue bedrest in Trendelenburg position. Will plan u/s for cervical length 1 wk post cerclage. - fetal well being. Premature. q  shift kick counts.   Taitum Menton A. 04/20/2015 5:02 PM

## 2015-04-20 NOTE — Progress Notes (Signed)
Pt reported feeling like she should have a greater volume when she urinates, and expressed concern that she was retaining urine. RN bladder scanned pt to confirm no bladder distention - bladder scan volume WNL.

## 2015-04-21 NOTE — Progress Notes (Signed)
HD#7, POD #5 s/p rescue McDonald cerclage  Pt notes feeling well this am, no d/c, no bleeding, no odor. Still tolerating T'berg position. No contractions. She is feeling fetal movement, no fevers, no abdominal pain. Remains on PNV, Colace, Protonix. Started 17-P  O: Filed Vitals:   04/20/15 2328 04/21/15 0130 04/21/15 0555 04/21/15 0817  BP:   126/76 117/68  Pulse:   87 85  Temp: 98.4 F (36.9 C)  98 F (36.7 C) 98.2 F (36.8 C)  TempSrc: Oral  Oral Oral  Resp: 18 18 18 18   Height:      Weight:      SpO2:       Gen: well appearing, upbeat, no distress Abd: soft, gravid, NT GU: def today LE: SCD's in place, NT  CBC    Component Value Date/Time   WBC 9.3 04/20/2015 1644   RBC 3.59* 04/20/2015 1644   HGB 10.1* 04/20/2015 1644   HCT 30.8* 04/20/2015 1644   PLT 233 04/20/2015 1644   MCV 85.8 04/20/2015 1644   MCH 28.1 04/20/2015 1644   MCHC 32.8 04/20/2015 1644   RDW 13.4 04/20/2015 1644   LYMPHSABS 2.5 04/16/2015 0509   MONOABS 0.8 04/16/2015 0509   EOSABS 0.1 04/16/2015 0509   BASOSABS 0.0 04/16/2015 0509      A/P: 34 yo G1 at 21'6 with IVF pregnancy and cervical incompetence s/p MacDonald rescue cerclage at 21'1 - cervical incompetence. D/w pt that while no immediate complications of cerclage, she remains at risk of recurrent cervical incompetence, SROM, PTL, PTD and chorioamnionitis. Clinical vigilance with attention to symptoms. Still remote from delivery. Will plan inhouse bed rest as degree of cervical dilation and effacement only allowed limited amount of cervix to be closed with cerclage. Her cervix in unlikely to withstand significant pressure. Continue bedrest in Trendelenburg position. Will plan u/s for cervical length 1 wk post cerclage. - fetal well being. Premature. q shift kick counts.   Angles Trevizo A. 04/21/2015 11:38 AM

## 2015-04-22 ENCOUNTER — Ambulatory Visit (HOSPITAL_COMMUNITY): Payer: BLUE CROSS/BLUE SHIELD

## 2015-04-22 DIAGNOSIS — N883 Incompetence of cervix uteri: Secondary | ICD-10-CM | POA: Insufficient documentation

## 2015-04-22 DIAGNOSIS — O09529 Supervision of elderly multigravida, unspecified trimester: Secondary | ICD-10-CM | POA: Insufficient documentation

## 2015-04-22 DIAGNOSIS — Z3A22 22 weeks gestation of pregnancy: Secondary | ICD-10-CM | POA: Insufficient documentation

## 2015-04-22 NOTE — Progress Notes (Signed)
Met with patient to discuss ultrasound results and plan of care  Ultrasound shows previable fetus in the breech position with membranes bulging and fetal parts with in a U-shaped funneled cervix. Cerclage in place and no membranes distal to cerclage.  I discussed with patient that she remains that high risk of preterm delivery, preterm rupture of membranes, chorioamnionitis. We discussed that given the limited ability of the cerclage to withstand significant pressures of supine position that I recommend continued bedrest, in Trendelenburg position if patient is able. Patient agrees to this and continued inpatient admission. Will continue strict bedrest with bedpan use it until viability. We also discussed the definition of viability and the ability for patient to choose intervention on fetal behalf between 23 and 24 weeks. If planning fetal intervention would begin 3 times daily fetal monitoring, betamethasone and cesarean section if patient were to show signs of chorioamnionitis or labor and fetus was still in the breech position. Would also plan cesarean section for fetal intolerance to labor regardless of fetal position. Patient understands that prior to 23 weeks we would not recommend intervention on fetal behalf. As patient approaches a 23 week mark we would plan for a NICU consult to help her decide on when to consider viability and intervention on behalf of the fetus.  As cervical length would not impact any further decision-making at this point, patient is remaining in-house on bedrest, we will not continue to follow cervical length. We will consider monthly growth ultrasounds once fetus reaches viability.  Xaiver Roskelley A. 04/22/2015 3:29 PM

## 2015-04-22 NOTE — Progress Notes (Signed)
I spent time with Janice [name], at her nurse's referral.  Janice [name] is in good spirits and is remaining both positive and optimistic.  She reports good family support and she is relying on her faith as well.  This served as our initial visit, and she was open to follow up support as well.  Centex CorporationChaplain Katy Monroe Toure Pager, [phone] 2:27 PM    04/22/15 1400  Clinical Encounter Type  Visited With Patient  Visit Type Spiritual support

## 2015-04-22 NOTE — Progress Notes (Signed)
Initial Nutrition Assessment  DOCUMENTATION CODES:   Obesity unspecified  INTERVENTION: Regular Diet/ Snacks TID     NUTRITION DIAGNOSIS:   Increased nutrient needs related to  (pregnancy and fetal growth requirements) as evidenced by  ([redacted] weeks gestation).    GOAL:   Patient will meet greater than or equal to 90% of their needs    MONITOR:   Weight trends  REASON FOR ASSESSMENT:   Antenatal    ASSESSMENT: 22 weeks w/ cervical incompetence, in trendelenberg. Pt has no problems eating meals, diet tolerated well and appetite is good. Pre-pregnancy weight 220 lbs, weight gain 20 lbs. BMI 37.9  Weight gain goals 11-20 lbs.   Diet Order:  Diet regular Room service appropriate?: Yes; Fluid consistency:: Thin   Height:   Ht Readings from Last 1 Encounters:  04/15/15 5\' 4"  (1.626 m)    Weight:   Wt Readings from Last 1 Encounters:  04/15/15 240 lb (108.863 kg)    Ideal Body Weight:     Wt Readings from Last 10 Encounters:  04/15/15 240 lb (108.863 kg)    BMI:  Body mass index is 41.18 kg/(m^2).  Estimated Nutritional Needs:   Kcal:   2200-2300 Kcal  Protein:   93-103 g protein  Fluid:    2.4 L  EDUCATION NEEDS:   No education needs identified at this time  Inez PilgrimKatherine Olga Bourbeau M.Odis LusterEd. R.D. LDN Neonatal Nutrition Support Specialist/RD III Pager [phone]      Phone [phone]

## 2015-04-22 NOTE — Progress Notes (Signed)
Hospital day # 7 pregnancy at 3325w0d  Incompetent Cervix post rescue McDonald Cerclage x 6 days.  S: well, upbeat, reports good fetal activity, stronger kicks      Contractions: rare BH.      Vaginal bleeding:none       No Fluid leak       Vaginal discharge: no significant change  O: BP 103/62 mmHg  Pulse 92  Temp(Src) 97.5 F (36.4 C) (Oral)  Resp 16  Ht 5\' 4"  (1.626 m)  Wt 240 lb (108.863 kg)  BMI 41.18 kg/m2  SpO2 98%      FHT 148/min this am      Uterus non-tender      Extremities: no significant edema and no signs of DVT   Results for Audrie GallusMCCALLUM, Shakirah R (MRN [id number]) as of 04/22/2015 10:43  Ref. Range 04/20/2015 16:44  WBC Latest Ref Range: 4.0-10.5 K/uL 9.3  RBC Latest Ref Range: 3.87-5.11 MIL/uL 3.59 (L)  Hemoglobin Latest Ref Range: 12.0-15.0 g/dL 40.910.1 (L)  HCT Latest Ref Range: 36.0-46.0 % 30.8 (L)  MCV Latest Ref Range: 78.0-100.0 fL 85.8  MCH Latest Ref Range: 26.0-34.0 pg 28.1  MCHC Latest Ref Range: 30.0-36.0 g/dL 81.132.8  RDW Latest Ref Range: 11.5-15.5 % 13.4  Platelets Latest Ref Range: 150-400 K/uL 233    A: 3525w0d with Incompetent cervix post rescue cerclage x 6 days.  P: Continue current plan of care.  US Cervix ordered to be done today.  Pricella Gaugh,MARIE-LYNE  MD 04/22/2015 10:39 AM

## 2015-04-23 LAB — URINALYSIS, ROUTINE W REFLEX MICROSCOPIC
Bilirubin Urine: NEGATIVE
GLUCOSE, UA: NEGATIVE mg/dL
HGB URINE DIPSTICK: NEGATIVE
Ketones, ur: NEGATIVE mg/dL
Nitrite: NEGATIVE
PH: 5.5 (ref 5.0–8.0)
Protein, ur: NEGATIVE mg/dL
Specific Gravity, Urine: 1.01 (ref 1.005–1.030)
Urobilinogen, UA: 4 mg/dL — ABNORMAL HIGH (ref 0.0–1.0)

## 2015-04-23 LAB — TYPE AND SCREEN
ABO/RH(D): O POS
Antibody Screen: NEGATIVE

## 2015-04-23 LAB — URINE MICROSCOPIC-ADD ON

## 2015-04-23 NOTE — Plan of Care (Signed)
Problem: Consults Goal: Birthing Suites Patient Information Press F2 to bring up selections list   Pt < [redacted] weeks EGA     

## 2015-04-23 NOTE — Progress Notes (Signed)
HD#9, POD #7 s/p rescue McDonald cerclage  Pt notes urinary frequency, no pain, no blood, no fevers, just voiding about q 1 hr. Small void and bladder scan shows moderate residual (200cc). Pt otherwise feeling well this am, no d/c, no bleeding, no odor. Still tolerating T'berg position. No contractions. She is feeling fetal movement, no fevers, no abdominal pain. Remains on PNV, Colace, Protonix. 17-P  O: Filed Vitals:   04/22/15 1945 04/23/15 0019 04/23/15 0607 04/23/15 0800  BP: 126/73 112/76 96/71 144/84  Pulse: 79 82 94 91  Temp: 98.1 F (36.7 C) 98.2 F (36.8 C)  98.4 F (36.9 C)  TempSrc: Oral Oral  Oral  Resp: 20 20 18 16   Height:      Weight:      SpO2:       Gen: well appearing, upbeat, no distress Abd: soft, gravid, NT GU: def today LE: SCD's in place, NT  CBC    Component Value Date/Time   WBC 9.3 04/20/2015 1644   RBC 3.59* 04/20/2015 1644   HGB 10.1* 04/20/2015 1644   HCT 30.8* 04/20/2015 1644   PLT 233 04/20/2015 1644   MCV 85.8 04/20/2015 1644   MCH 28.1 04/20/2015 1644   MCHC 32.8 04/20/2015 1644   RDW 13.4 04/20/2015 1644   LYMPHSABS 2.5 04/16/2015 0509   MONOABS 0.8 04/16/2015 0509   EOSABS 0.1 04/16/2015 0509   BASOSABS 0.0 04/16/2015 0509      A/P: 34 yo G1 at 22'1 with IVF pregnancy and cervical incompetence s/p MacDonald rescue cerclage at 21'1 - cervical incompetence. D/w pt that while no immediate complications of cerclage, she remains at risk of recurrent cervical incompetence, SROM, PTL, PTD and chorioamnionitis. Clinical vigilance with attention to symptoms. Still remote from delivery. Will plan inhouse bed rest as degree of cervical dilation and effacement only allowed limited amount of cervix to be closed with cerclage. Her cervix in unlikely to withstand significant pressure. Continue bedrest in Trendelenburg position.  - fetal well being. Premature. q shift kick counts. Once deciding on intervention on behalf of fetus 23-24 wks depending on  conversation with NICU and patient desire, will plan BMZ. Plan NICU consult next wk - urinary frequency, check UA, UCx. Likely due to positioning  Irvan Tiedt A. 04/23/2015 8:14 AM

## 2015-04-24 LAB — URINE CULTURE

## 2015-04-24 MED ORDER — FERROUS SULFATE 325 (65 FE) MG PO TABS
325.0000 mg | ORAL_TABLET | Freq: Every day | ORAL | Status: DC
  Administered 2015-04-25: 325 mg via ORAL
  Filled 2015-04-24: qty 1

## 2015-04-24 NOTE — Progress Notes (Signed)
HD#19, POD #9 s/p rescue McDonald cerclage  Pt notes slight improvement to urinary frequency- yesterday only void about q 2 hrs, UCx pending, no pain, no blood, no fevers. Feeling well this am, no d/c, no bleeding, no odor. Still tolerating T'berg position. No contractions. She is feeling fetal movement, no fevers, no abdominal pain. Remains on PNV, Colace, Protonix. 17-P  O: Filed Vitals:   04/23/15 1929 04/24/15 0026 04/24/15 0551 04/24/15 0833  BP: 116/72 115/68 133/88 119/71  Pulse: 80 89 90 92  Temp: 97.9 F (36.6 C) 97.8 F (36.6 C) 98.2 F (36.8 C) 98.4 F (36.9 C)  TempSrc: Oral Oral Oral Oral  Resp: 18 18 18 16   Height:      Weight:      SpO2:       Gen: well appearing, upbeat, no distress Abd: soft, gravid, NT GU: def today LE: SCD's in place, NT  CBC    Component Value Date/Time   WBC 9.3 04/20/2015 1644   RBC 3.59* 04/20/2015 1644   HGB 10.1* 04/20/2015 1644   HCT 30.8* 04/20/2015 1644   PLT 233 04/20/2015 1644   MCV 85.8 04/20/2015 1644   MCH 28.1 04/20/2015 1644   MCHC 32.8 04/20/2015 1644   RDW 13.4 04/20/2015 1644   LYMPHSABS 2.5 04/16/2015 0509   MONOABS 0.8 04/16/2015 0509   EOSABS 0.1 04/16/2015 0509   BASOSABS 0.0 04/16/2015 0509      [name]/P: 34 yo G1 at 22'2 with IVF pregnancy and cervical incompetence s/p MacDonald rescue cerclage at 21'1 - cervical incompetence. D/w pt that while no immediate complications of cerclage, she remains at risk of recurrent cervical incompetence, SROM, PTL, PTD and chorioamnionitis. Clinical vigilance with attention to symptoms. Still remote from delivery. Will plan inhouse bed rest as degree of cervical dilation and effacement only allowed limited amount of cervix to be closed with cerclage. Her cervix in unlikely to withstand significant pressure. Continue bedrest in Trendelenburg position. Continue weekly 17-P - fetal well being. Premature. q shift kick counts. Once deciding on intervention on behalf of fetus 23-24 wks  depending on conversation with NICU and patient desire, will plan BMZ. Plan NICU consult next wk - urinary frequency, check UA, UCx. Likely due to positioning - anemia, start iron  Janice [name]. 04/24/2015 11:57 AM

## 2015-04-25 NOTE — Progress Notes (Signed)
Patient c/o blood on tissue when wiped. Placed pad on. Will continue to monitor

## 2015-04-25 NOTE — Consult Note (Signed)
[facility]  [facility] 04/25/2015    6:36 PM  Neonatal Medicine Consultation         Audrie GallusKendra R [name]          MRN:  [id number]  I was called at the request of the patient's obstetrician (Drs Ernestina PennaFogleman and Juliene PinaMody) to speak to this patient due to potential extremely premature birth.    The patient's prenatal course includes incompetent cervix and recent placement (7/9) of a cerclage.  She is 22 3/7 weeks currently.  She is admitted to antenatal unit, and is receiving treatment that includes indomethacin, weekly 17-OHP, metformin.   Mom doesn't yet know the baby's gender.  I reviewed expectations for a baby born at 22 weeks (non-viable), 23 weeks (unlikely survival that is associated with a very high rate of moderate to profound neurodevelopmental compromise), 24 weeks (about 50/50 survival and still with high risk of neurodevelopmental compromise). Ethically, for a baby born at 3323 or 24 weeks, we consider the risk of death or survival with significant neurodevelopmental impairment to be likely enough that it is acceptable for a woman to ask us not to resuscitate her newborn.  For babies born at 25 weeks and beyond, survival and reduced morbidity is much more likely, so such babies should be resuscitated and provided the support needed to survive.  I talked to the patient about issues such as survival, length of stay, morbidities such as respiratory distress, IVH, infection, retinopathy.  I described how we provide respiratory and feeding support.  I let mom know that the baby's outlook generally improves the longer she remains undelivered.  Thank you for asking me to speak to this patient.  I spent 20 minutes reviewing the record, speaking to the patient, and entering appropriate documentation.  More than 50% of the time was spent face to face with patient.   _____________________ Electronically Signed By: Angelita InglesMcCrae S. Dee Maday, MD Neonatologist

## 2015-04-25 NOTE — Progress Notes (Addendum)
HD#19, POD # 10 s/p rescue McDonald cerclage  Feels well, no pain/ cramping/ bleeding/ pressure. Some bowel complaints, needs to bear down to start bm. Will add Colace bid, add magnesium if needed. Continue PNV, Colace, Protonix. 17-P. Added Iron yesterday.   Ceasar Mons: Filed Vitals:   04/24/15 2145 04/25/15 0054 04/25/15 0544 04/25/15 0822  BP: 112/72 124/80 113/76 115/61  Pulse: 92 95 95 88  Temp:    98.5 F (36.9 C)  TempSrc:    Oral  Resp: 18   18  Height:      Weight:      SpO2:       Gen: well appearing, upbeat, no distress Abd: soft, gravid, NT GU: def today LE: SCD's in place, NT  A/P: 34 yo G1 at 9122. 3 wks with IVF pregnancy and cervical incompetence s/p MacDonald rescue cerclage at 21'1 - cervical incompetence. No SROM, PTL,  chorioamnionitis.  Still remote from delivery. Will plan inhouse bed rest as degree of cervical dilation and effacement only allowed limited amount of cervix to be closed with cerclage. Her cervix in unlikely to withstand significant pressure. Continue bedrest in Trendelenburg position. Continue weekly 17-P - fetal well being. Premature. q shift FHTone, Kick counts. NICU consult today to plan at what gestational age 36 or 24 wks intervention desired. C/section for breech. - anemia, start iron -Urine culture 7/16 - Strep Agalactiae, low colonies 4000. NO treatment  Teanna Elem R 04/25/2015 12:38 PM

## 2015-04-26 ENCOUNTER — Inpatient Hospital Stay (HOSPITAL_COMMUNITY): Payer: BLUE CROSS/BLUE SHIELD | Admitting: Anesthesiology

## 2015-04-26 ENCOUNTER — Encounter (HOSPITAL_COMMUNITY): Payer: Self-pay | Admitting: *Deleted

## 2015-04-26 MED ORDER — ZOLPIDEM TARTRATE 5 MG PO TABS: 5.0000 mg | ORAL_TABLET | Freq: Every evening | ORAL | Status: DC | PRN

## 2015-04-26 MED ORDER — METHYLDOPA 500 MG PO TABS
500.0000 mg | ORAL_TABLET | Freq: Two times a day (BID) | ORAL | Status: DC
  Administered 2015-04-26 – 2015-04-27 (×3): 500 mg via ORAL
  Filled 2015-04-26 (×5): qty 1

## 2015-04-26 MED ORDER — PHENYLEPHRINE 40 MCG/ML (10ML) SYRINGE FOR IV PUSH (FOR BLOOD PRESSURE SUPPORT)
PREFILLED_SYRINGE | INTRAVENOUS | Status: AC
  Filled 2015-04-26: qty 20

## 2015-04-26 MED ORDER — DIPHENHYDRAMINE HCL 50 MG/ML IJ SOLN: 12.5000 mg | INTRAMUSCULAR | Status: DC | PRN

## 2015-04-26 MED ORDER — EPHEDRINE 5 MG/ML INJ
10.0000 mg | INTRAVENOUS | Status: DC | PRN
  Filled 2015-04-26: qty 2

## 2015-04-26 MED ORDER — PHENYLEPHRINE 40 MCG/ML (10ML) SYRINGE FOR IV PUSH (FOR BLOOD PRESSURE SUPPORT): 80.0000 ug | PREFILLED_SYRINGE | INTRAVENOUS | Status: DC | PRN

## 2015-04-26 MED ORDER — FENTANYL 2.5 MCG/ML BUPIVACAINE 1/10 % EPIDURAL INFUSION (WH - ANES)
INTRAMUSCULAR | Status: AC
  Filled 2015-04-26: qty 125

## 2015-04-26 MED ORDER — OXYTOCIN 40 UNITS IN LACTATED RINGERS INFUSION - SIMPLE MED
1.0000 m[IU]/min | INTRAVENOUS | Status: DC
  Administered 2015-04-26: 2 m[IU]/min via INTRAVENOUS
  Administered 2015-04-26: 666 m[IU]/min via INTRAVENOUS

## 2015-04-26 MED ORDER — OXYCODONE-ACETAMINOPHEN 5-325 MG PO TABS: 2.0000 | ORAL_TABLET | ORAL | Status: DC | PRN

## 2015-04-26 MED ORDER — WITCH HAZEL-GLYCERIN EX PADS: 1.0000 "application " | MEDICATED_PAD | CUTANEOUS | Status: DC | PRN

## 2015-04-26 MED ORDER — PHENYLEPHRINE 40 MCG/ML (10ML) SYRINGE FOR IV PUSH (FOR BLOOD PRESSURE SUPPORT)
80.0000 ug | PREFILLED_SYRINGE | INTRAVENOUS | Status: DC | PRN
  Filled 2015-04-26: qty 2

## 2015-04-26 MED ORDER — LIDOCAINE HCL (PF) 1 % IJ SOLN
INTRAMUSCULAR | Status: DC | PRN
  Administered 2015-04-26 (×2): 4 mL via EPIDURAL

## 2015-04-26 MED ORDER — BENZOCAINE-MENTHOL 20-0.5 % EX AERO: 1.0000 "application " | INHALATION_SPRAY | CUTANEOUS | Status: DC | PRN

## 2015-04-26 MED ORDER — FENTANYL 2.5 MCG/ML BUPIVACAINE 1/10 % EPIDURAL INFUSION (WH - ANES)
14.0000 mL/h | INTRAMUSCULAR | Status: DC | PRN
Start: 2015-04-26 — End: 2015-04-26

## 2015-04-26 MED ORDER — FENTANYL 2.5 MCG/ML BUPIVACAINE 1/10 % EPIDURAL INFUSION (WH - ANES)
14.0000 mL/h | INTRAMUSCULAR | Status: DC | PRN
  Administered 2015-04-26: 14 mL/h via EPIDURAL

## 2015-04-26 MED ORDER — PRENATAL MULTIVITAMIN CH
1.0000 | ORAL_TABLET | Freq: Every day | ORAL | Status: DC
  Administered 2015-04-26 – 2015-04-27 (×2): 1 via ORAL
  Filled 2015-04-26 (×2): qty 1

## 2015-04-26 MED ORDER — ONDANSETRON HCL 4 MG/2ML IJ SOLN: 4.0000 mg | INTRAMUSCULAR | Status: DC | PRN

## 2015-04-26 MED ORDER — OXYTOCIN 40 UNITS IN LACTATED RINGERS INFUSION - SIMPLE MED
INTRAVENOUS | Status: AC
  Filled 2015-04-26: qty 1000

## 2015-04-26 MED ORDER — FENTANYL 2.5 MCG/ML BUPIVACAINE 1/10 % EPIDURAL INFUSION (WH - ANES): 14.0000 mL/h | INTRAMUSCULAR | Status: DC | PRN

## 2015-04-26 MED ORDER — DIBUCAINE 1 % RE OINT: 1.0000 "application " | TOPICAL_OINTMENT | RECTAL | Status: DC | PRN

## 2015-04-26 MED ORDER — TERBUTALINE SULFATE 1 MG/ML IJ SOLN: 0.2500 mg | Freq: Once | INTRAMUSCULAR | Status: DC | PRN

## 2015-04-26 MED ORDER — ONDANSETRON HCL 4 MG PO TABS: 4.0000 mg | ORAL_TABLET | ORAL | Status: DC | PRN

## 2015-04-26 MED ORDER — METFORMIN HCL 500 MG PO TABS
1000.0000 mg | ORAL_TABLET | Freq: Two times a day (BID) | ORAL | Status: DC
  Administered 2015-04-26 – 2015-04-27 (×2): 1000 mg via ORAL
  Filled 2015-04-26 (×4): qty 2

## 2015-04-26 MED ORDER — SIMETHICONE 80 MG PO CHEW: 80.0000 mg | CHEWABLE_TABLET | ORAL | Status: DC | PRN

## 2015-04-26 MED ORDER — ACETAMINOPHEN 325 MG PO TABS: 650.0000 mg | ORAL_TABLET | ORAL | Status: DC | PRN

## 2015-04-26 MED ORDER — IBUPROFEN 600 MG PO TABS
600.0000 mg | ORAL_TABLET | Freq: Four times a day (QID) | ORAL | Status: DC
  Administered 2015-04-26 – 2015-04-27 (×5): 600 mg via ORAL
  Filled 2015-04-26 (×5): qty 1

## 2015-04-26 MED ORDER — OXYCODONE-ACETAMINOPHEN 5-325 MG PO TABS: 1.0000 | ORAL_TABLET | ORAL | Status: DC | PRN

## 2015-04-26 MED ORDER — DIPHENHYDRAMINE HCL 25 MG PO CAPS: 25.0000 mg | ORAL_CAPSULE | Freq: Four times a day (QID) | ORAL | Status: DC | PRN

## 2015-04-26 MED ORDER — LANOLIN HYDROUS EX OINT: TOPICAL_OINTMENT | CUTANEOUS | Status: DC | PRN

## 2015-04-26 NOTE — Anesthesia Procedure Notes (Signed)
Epidural Patient location during procedure: OB Start time: 04/26/2015 7:04 AM  Staffing Anesthesiologist: Mal AmabileFOSTER, Tirza Senteno Performed by: anesthesiologist   Preanesthetic Checklist Completed: patient identified, site marked, surgical consent, pre-op evaluation, timeout performed, IV checked, risks and benefits discussed and monitors and equipment checked  Epidural Patient position: sitting Prep: site prepped and draped and DuraPrep Patient monitoring: continuous pulse ox and blood pressure Approach: midline Location: L3-L4 Injection technique: LOR air  Needle:  Needle type: Tuohy  Needle gauge: 17 G Needle length: 9 cm and 9 Needle insertion depth: 7 cm Catheter type: closed end flexible Catheter size: 19 Gauge Catheter at skin depth: 12 cm Test dose: negative and Other  Assessment Events: blood not aspirated, injection not painful, no injection resistance, negative IV test and no paresthesia  Additional Notes Patient identified. Risks and benefits discussed including failed block, incomplete  Pain control, post dural puncture headache, nerve damage, paralysis, blood pressure Changes, nausea, vomiting, reactions to medications-both toxic and allergic and post Partum back pain. All questions were answered. Patient expressed understanding and wished to proceed. Sterile technique was used throughout procedure. Epidural site was Dressed with sterile barrier dressing. No paresthesias, signs of intravascular injection Or signs of intrathecal spread were encountered.  Patient was more comfortable after the epidural was dosed. Please see RN's note for documentation of vital signs and FHR which are stable.

## 2015-04-26 NOTE — Progress Notes (Signed)
Bleeding still present on tissue after wiping. Pad placed at 2337 had small amount of blood. New pad placed, IV started, placed on toco monitor

## 2015-04-26 NOTE — Progress Notes (Signed)
I spent time with Janice [name] as well as with her cousin, Gabriel RungMonique who has been through something similar.  I offered grief support and pastoral presence.  The family wanted time with their baby privately.    I will follow up tomorrow morning for continued grief support as well as grief education.  [address]Chaplain [name], Bcc Pager, [phone] 4:06 PM    04/26/15 1600  Clinical Encounter Type  Visited With Patient;Family  Visit Type Spiritual support;Death  Spiritual Encounters  Spiritual Needs Grief support

## 2015-04-26 NOTE — Progress Notes (Signed)
On call physician Pt new to me. Called by RN regarding increased vaginal bleeding w/ cramping and rectal pressure( feel like need to have BM) Hx reviewed: 22 4/7 weeks with rescue cerclage and no measurable cervix SSE: bulging membranes noted Digital exam showed no palp cervix  Ant palp suture. Palp fetus  IMP: complete IUP @ 22 4/7 weeks  Cervical incompetence w/ cerclage Inevitable delivery P) transfer  To L&D Pt asked regarding C/S. Advised not a candidate after C/S given gestational age Will remove cerclage. Disc may need D&C for removal of placenta if retained Dr Ernestina PennaFogleman notified

## 2015-04-26 NOTE — Progress Notes (Signed)
Awaiting placenta delivery 

## 2015-04-26 NOTE — Progress Notes (Signed)
Talked with Chaplain regarding pt's status.  Will be by for consultation.

## 2015-04-26 NOTE — Plan of Care (Signed)
Problem: Consults Goal: Birthing Suites Patient Information Press F2 to bring up selections list  Outcome: Completed/Met Date Met:  04/26/15  Pt < [redacted] weeks EGA, Fetal indication and Other (specify with a note) incompetent cervix with imminent delivery

## 2015-04-26 NOTE — Anesthesia Preprocedure Evaluation (Signed)
Anesthesia Evaluation  Patient identified by MRN, date of birth, ID band Patient awake    Reviewed: Allergy & Precautions, NPO status , Patient's Chart, lab work & pertinent test results  Airway Mallampati: III  TM Distance: >3 FB Neck ROM: Full    Dental no notable dental hx. (+) Teeth Intact   Pulmonary neg pulmonary ROS,  breath sounds clear to auscultation  Pulmonary exam normal       Cardiovascular hypertension, Pt. on medications Normal cardiovascular examRhythm:Regular Rate:Normal     Neuro/Psych negative neurological ROS  negative psych ROS   GI/Hepatic negative GI ROS, Neg liver ROS,   Endo/Other  Morbid obesity  Renal/GU negative Renal ROS  negative genitourinary   Musculoskeletal negative musculoskeletal ROS (+)   Abdominal (+) + obese,   Peds  Hematology  (+) anemia ,   Anesthesia Other Findings   Reproductive/Obstetrics (+) Pregnancy 22 4/[redacted]  weeks gestation Bulging membranes Incompetent Cervix Double footling breech                             Anesthesia Physical  Anesthesia Plan  ASA: III  Anesthesia Plan: Epidural   Post-op Pain Management:    Induction:   Airway Management Planned: Natural Airway  Additional Equipment:   Intra-op Plan:   Post-operative Plan:   Informed Consent: I have reviewed the patients History and Physical, chart, labs and discussed the procedure including the risks, benefits and alternatives for the proposed anesthesia with the patient or authorized representative who has indicated his/her understanding and acceptance.     Plan Discussed with: Anesthesiologist, Surgeon and CRNA  Anesthesia Plan Comments:         Anesthesia Quick Evaluation

## 2015-04-26 NOTE — Progress Notes (Signed)
Pt transferred to L&D. Awaited intervention due to physician in OR Pt informed. Denies any pain VE: bulging membrane lower in vagina with moving fetal parts Therefore sono done : footling breech presentation Palp cerclage high in the vagina anteriorly Pt and family informed of need to remove cerclage to prevent cervical tearing as well as poss head entrapment given the presentation. Advised if that were to happen then need cervical lacerations Consent signed for removal of cerclage Procedure:  Sterile speculum placed but due to the bulge of membrane , cerclage could not be seen Speculum removed. Digital exam done: palpated suture. Grasp with ring clamp and gently pulled. Vaginal retractors placed and while getting ready to get scissors to cut the suture, srom occurred clear fluid w/o odor. Feet in vagina Suture removed w/o diff. reexam done. Some decompression of cervix noted Pt advised need for pitocin to deliver the fetus. Epidural to help facilitate labor and ultimate delivery as well as in the event surgical removal of placenta needed. Pt and family express understanding and agree with plan

## 2015-04-26 NOTE — Progress Notes (Signed)
Dr. Cherly Hensenousins notified of blood on tissue when wipe, none on pad. Patient stable will Continue to monitor

## 2015-04-26 NOTE — Progress Notes (Signed)
S: Pt notes still some right leg numbness, minimal bleeding. No pain, no abdominal cramping. Pt appropriately grieving. Holding baby, happy for family to see baby Penni BombardKendall. Feels some relief over not having to make a decision about intervention for fetal well being.    O: Filed Vitals:   04/26/15 1300  BP: 131/91  Pulse: 104  Temp: 98.9 F (37.2 C)  Resp: 18   Gen: appropriate affect/ mood, good eye contact Abd: soft, NT, ND  A/P PPD#0 s/p SVD of pre-viable female due to cervical incompetence. - Debriefing of events surrounding care/ delivery. Pt expresses understanding. Asking questions about when she can try again and how care will be different. Grieving appropriately, risks of PP depression d/w pt. Support from family. Hospital Chaplain has been to see pt.  - Dispo. Pt asks to spend night tonight for monitoring. Will watch for fevers, bp stability. Repeat CBC in am due to h/o anemia.   Antinette Keough A. 04/26/2015 4:27 PM

## 2015-04-26 NOTE — Progress Notes (Signed)
Dr. Cherly Hensenousins notified of increased bleeding with cramping and the need to have a BM. Per MD get speculum ready for eval.

## 2015-04-27 LAB — CBC
HCT: 30.7 % — ABNORMAL LOW (ref 36.0–46.0)
Hemoglobin: 9.6 g/dL — ABNORMAL LOW (ref 12.0–15.0)
MCH: 27.1 pg (ref 26.0–34.0)
MCHC: 31.3 g/dL (ref 30.0–36.0)
MCV: 86.7 fL (ref 78.0–100.0)
Platelets: 253 10*3/uL (ref 150–400)
RBC: 3.54 MIL/uL — ABNORMAL LOW (ref 3.87–5.11)
RDW: 13.5 % (ref 11.5–15.5)
WBC: 11.5 10*3/uL — AB (ref 4.0–10.5)

## 2015-04-27 LAB — RPR: RPR Ser Ql: NONREACTIVE

## 2015-04-27 MED ORDER — ACETAMINOPHEN 325 MG PO TABS: 650.0000 mg | ORAL_TABLET | ORAL | Status: DC | PRN

## 2015-04-27 MED ORDER — FERROUS SULFATE 325 (65 FE) MG PO TABS: 325.0000 mg | ORAL_TABLET | Freq: Every day | ORAL | Status: DC

## 2015-04-27 MED ORDER — IBUPROFEN 600 MG PO TABS: 600.0000 mg | ORAL_TABLET | Freq: Four times a day (QID) | ORAL | Status: DC

## 2015-04-27 MED ORDER — DOCUSATE SODIUM 100 MG PO CAPS: 100.0000 mg | ORAL_CAPSULE | Freq: Two times a day (BID) | ORAL | Status: DC

## 2015-04-27 NOTE — Anesthesia Postprocedure Evaluation (Signed)
Anesthesia Post Note  Patient: Janice [name]  Procedure(s) Performed: * No procedures listed *  Anesthesia type: Epidural  Patient location: Mother/Baby  Post pain: Pain level controlled  Post assessment: Post-op Vital signs reviewed  Last Vitals:  Filed Vitals:   04/27/15 1156  BP: 133/99  Pulse: 103  Temp: 37.3 C  Resp: 18    Post vital signs: Reviewed  Level of consciousness:alert  Complications: No apparent anesthesia complications

## 2015-04-27 NOTE — Progress Notes (Signed)
Pt  Out in wheelchair   Teaching complete  

## 2015-04-27 NOTE — Discharge Summary (Signed)
Patient ID: Janice GallusKendra R Jedlicka MRN: [id number] DOB/AGE: February 02, 1981 34 y.o.  Admit date: 04/15/2015 Discharge date: 04/27/15  Admission Diagnoses: 21WKS, CTX bulging membranes  Cervical incompetence Chronic hypertension PCOS  Discharge Diagnoses: 21WKS, CTX bulging membranes \ Cervical incompetence        Discharged Condition: stable  Hospital Course: Admited with cervical incompetence, HD #2 had rescue cerclage after many discussions of R/B. Uncomplicated procedure. Pt remained on bed rest in trendelenberg. Continued Aldomet, metformin and started weekly 17-P. After 1.5 wks of expectant management, pt noticed cramping a spotting. He was found to be fully dilated in breech position. Cervical cerclage was cut, epidural given and pt had SVD of a non-viable female.  On PPD #1 pt was eating, walking, voiding with minimal lochia and no pain, grieving appropriately and looking forward to another pregnancy attempt  Filed Vitals:   04/26/15 1707 04/26/15 2201 04/27/15 0018 04/27/15 0529  BP: 123/76 135/95 123/75 124/76  Pulse: 104 99 94 106  Temp: 99.7 F (37.6 C) 98.5 F (36.9 C) 99.1 F (37.3 C) 98.5 F (36.9 C)  TempSrc: Oral Oral Oral Oral  Resp: 18  18 18   Height:      Weight:      SpO2: 99% 100% 100% 100%   Gen: well appearing, no distress Abd: soft, NT LE: NT, no edema  Consults: MFM  Treatments: surgery: cercalge and bedrest  Disposition: home     Medication List    TAKE these medications        acetaminophen 325 MG tablet  Commonly known as:  TYLENOL  Take 2 tablets (650 mg total) by mouth every 4 (four) hours as needed (for pain scale < 4).     aspirin 81 MG chewable tablet  Chew 81 mg by mouth daily.     docusate sodium 100 MG capsule  Commonly known as:  COLACE  Take 1 capsule (100 mg total) by mouth 2 (two) times daily.     ferrous sulfate 325 (65 FE) MG tablet  Commonly known as:  FERROUSUL  Take 1 tablet (325 mg total) by mouth daily with breakfast.     ibuprofen 600 MG tablet  Commonly known as:  ADVIL,MOTRIN  Take 1 tablet (600 mg total) by mouth every 6 (six) hours.     metFORMIN 1000 MG tablet  Commonly known as:  GLUCOPHAGE  Take 1,000 mg by mouth 2 (two) times daily with a meal.     methyldopa 500 MG tablet  Commonly known as:  ALDOMET  Take 1,000 mg by mouth 3 (three) times daily.     PRENATAL PO  Take 1 tablet by mouth daily.         Signed: Lendon ColonelFOGLEMAN,Maicy Filip A., MD MD 04/27/2015, 8:46 AM

## 2015-04-27 NOTE — Progress Notes (Signed)
I spent time this morning with Janice [name] and her aunt.  She continues to have good family support and reported that many people came yesterday to meet her baby and spend time with her and her boyfriend.   She reported that in the end, it was a nice good-bye--she was able to say good-bye on her time.  I offered additional grief support and grief education.  She is aware of [facility] and she is also in touch with her cousin who has found some good online support groups from her own experience with perinatal loss.    [address] Janice CarrelKaty Shateria [name], Bcc Pager, [phone] 11:38 AM    04/27/15 1100  Clinical Encounter Type  Visited With Patient  Visit Type Spiritual support  Spiritual Encounters  Spiritual Needs Emotional;Grief support

## 2015-12-07 IMAGING — US US OB DETAIL+14 WK
1 series · 12 of 28 positions shown · non-contrast
Comparison: none

[Series 1: us ob follow up · 126 acquisitions, 12 frames shown]
[im 5/126]
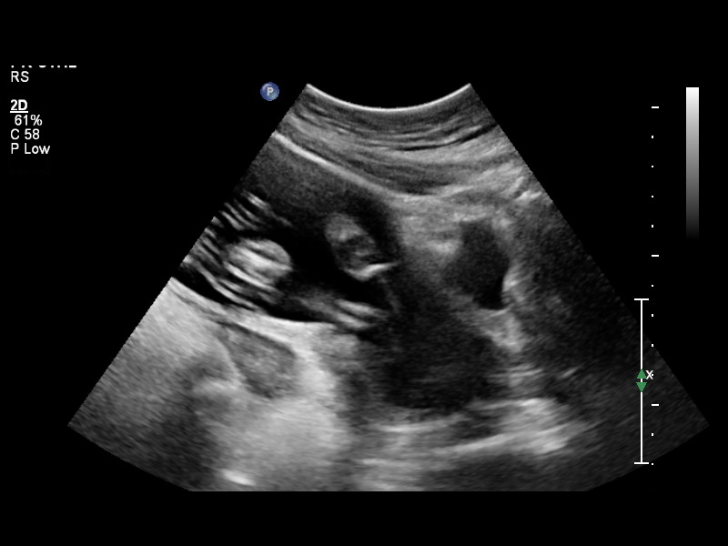
[im 14/126]
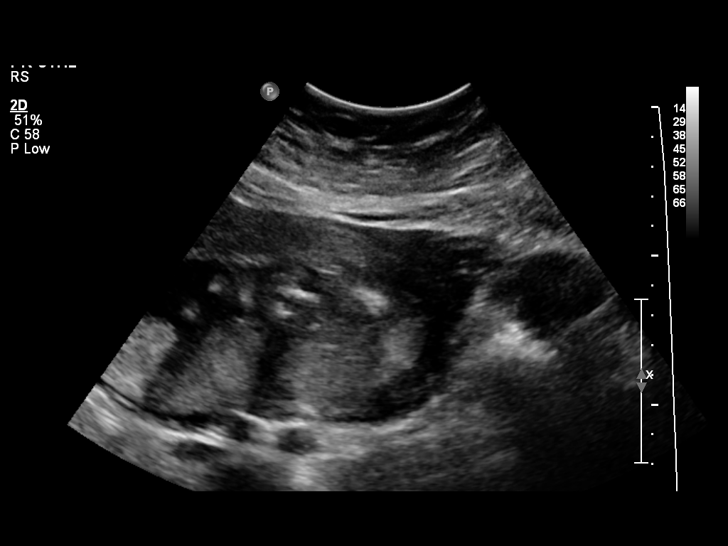
[im 24/126]
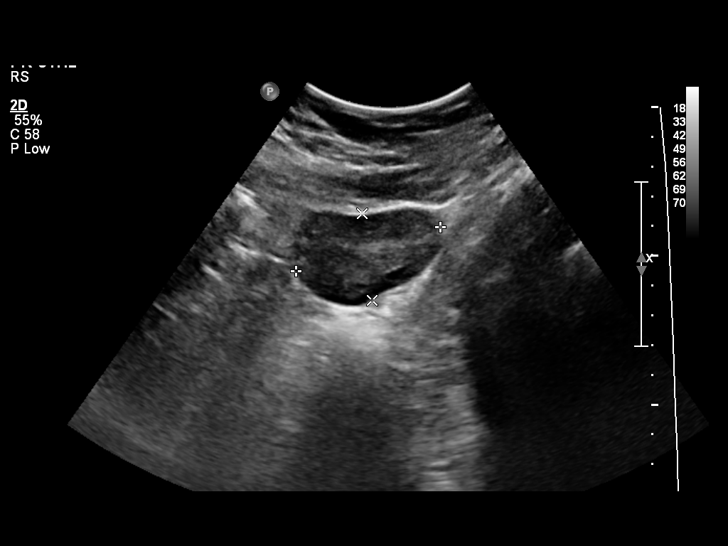
[im 38/126]
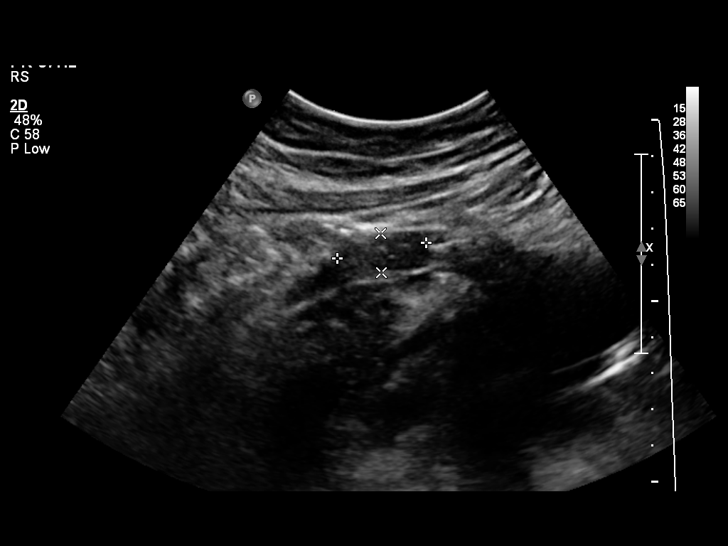
[im 47/126]
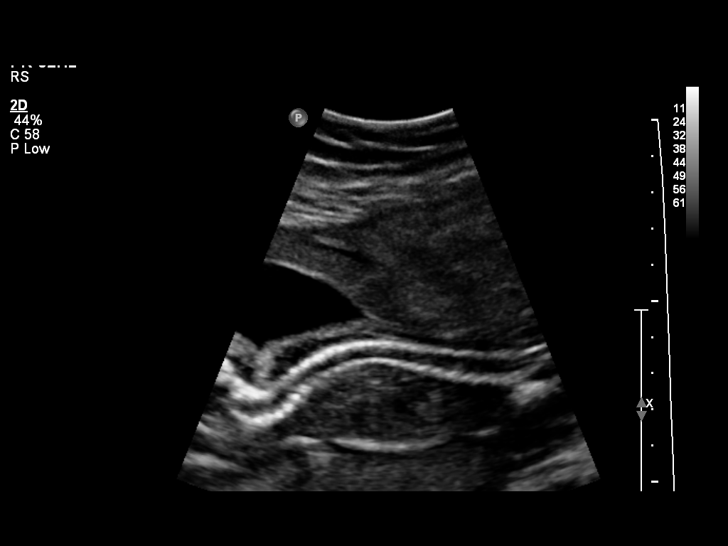
[im 56/126]
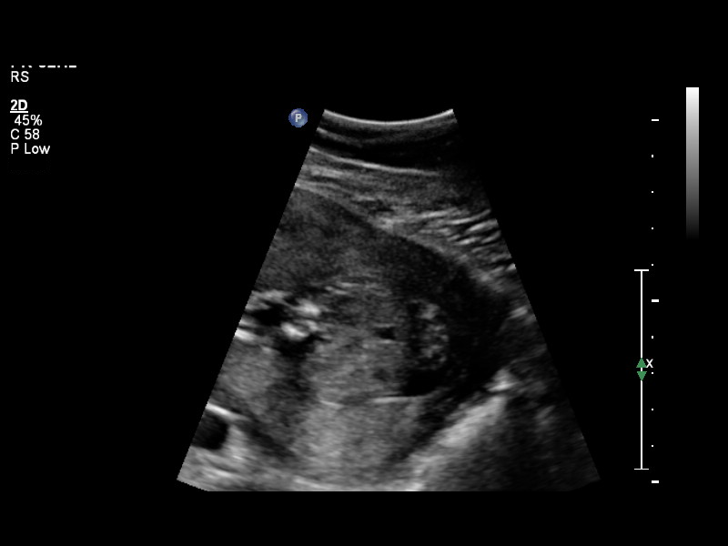
[im 70/126]
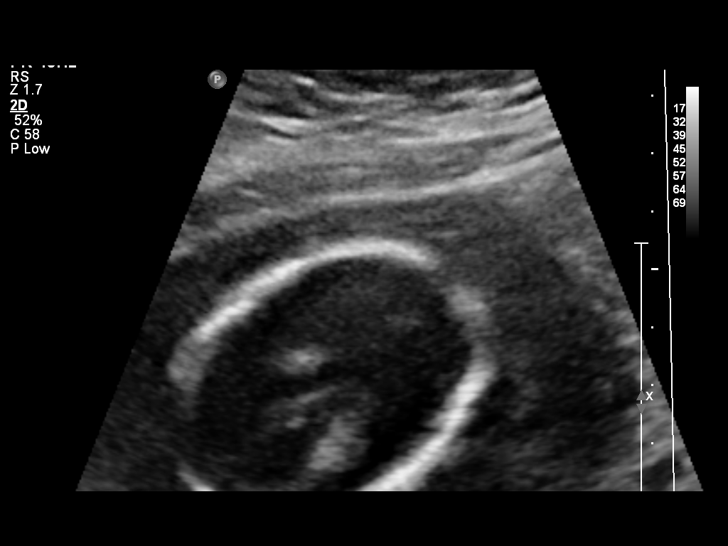
[im 79/126]
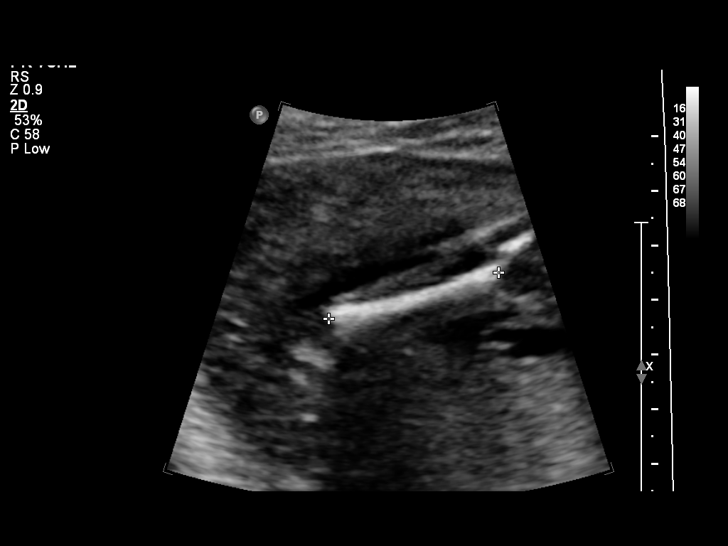
[im 88/126]
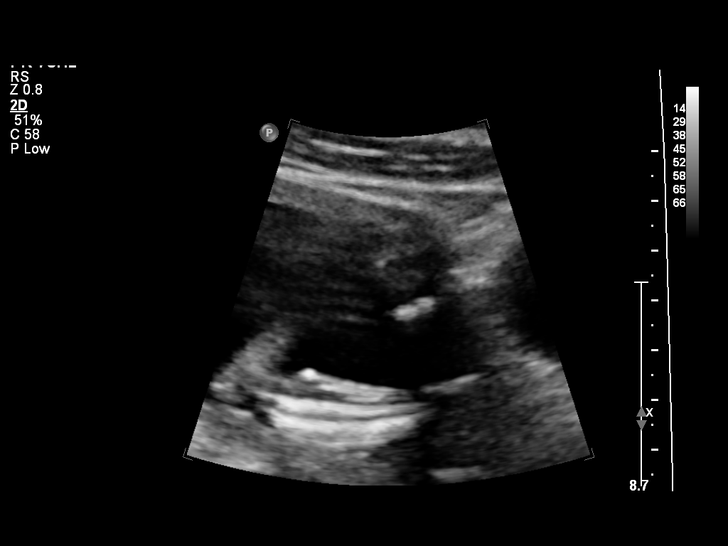
[im 102/126]
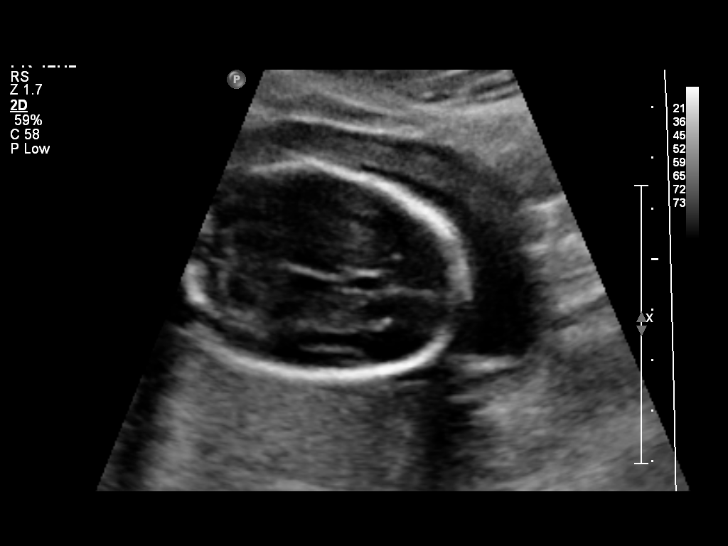
[im 112/126]
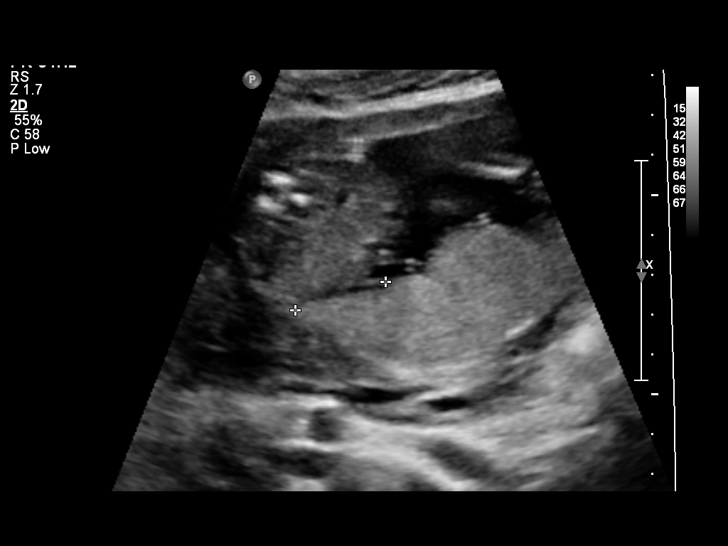
[im 121/126]
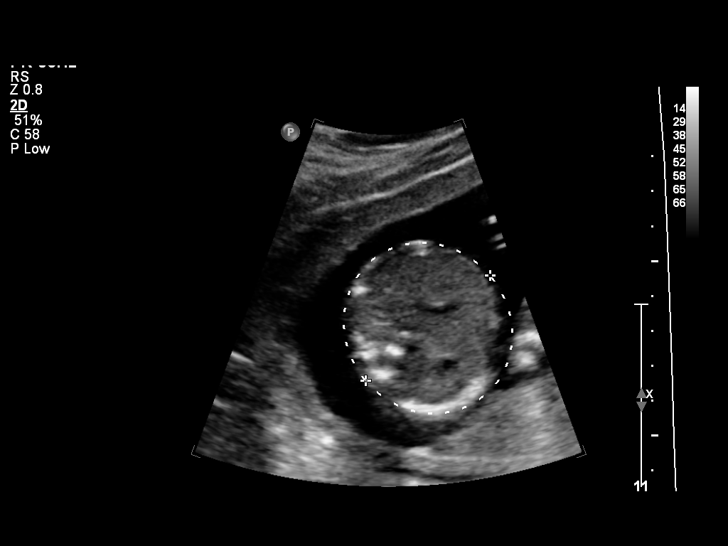

[12 of 28 positions shown; findings below may reference images not displayed]

OBSTETRICS REPORT
(Signed Final 04/15/2015 [DATE])

Service(s) Provided

US OB DETAIL + 14 WK                                  76811.0
Indications

Advanced maternal age primigravida 35+, second
trimester
Cervical insufficiency, 2nd
Hypertension - Chronic/Pre-existing (takes
methyldopa)
IVF
Fetal Evaluation

Num Of Fetuses:    1
Fetal Heart Rate:  147                          bpm
Cardiac Activity:  Observed
Presentation:      Breech, footling
Placenta:          Posterior, above cervical
os
P. Cord            Visualized, central
Insertion:

Amniotic Fluid
AFI FV:      Subjectively within normal limits
Larg Pckt:     4.4  cm
Biometry

BPD:       41  mm     G. Age:  18w 3d                CI:         71.3   70 - 86
OFD:     57.5  mm                                    FL/HC:      20.0   15.9 -
20.3
HC:     172.4  mm     G. Age:  19w 6d        5  %    HC/AC:      1.15   1.06 -
1.25
AC:     150.2  mm     G. Age:  20w 2d       22  %    FL/BPD:
FL:      34.5  mm     G. Age:  20w 6d       36  %    FL/AC:      23.0   20 - 24
HUM:     31.7  mm     G. Age:  20w 4d       35  %
CER:     19.2  mm     G. Age:  18w 4d      < 5  %

Est. FW:     350  gm    0 lb 12 oz      31  %
Gestational Age

U/S Today:     19w 6d                                        EDD:   09/03/15
Best:          21w 0d     Det. By:  Embryo Transfer          EDD:   08/26/15
Anatomy

Cranium:          Appears normal         Ductal Arch:      Not well visualized
Fetal Cavum:      Appears normal         Diaphragm:        Not well visualized
Ventricles:       Appears normal         Stomach:          Appears normal, left
sided
Choroid Plexus:   Appears normal         Abdomen:          Appears normal
Cerebellum:       Appears normal         Abdominal Wall:   Appears nml (cord
insert, abd wall)
Posterior Fossa:  Appears normal         Cord Vessels:     Appears normal (3
vessel cord)
Face:             Not well visualized    Kidneys:          Appear normal
Lips:             Not well visualized    Bladder:          Appears normal
Heart:            Not well visualized    Spine:            Appears normal
RVOT:             Not well visualized    Lower             Appears normal
Extremities:
LVOT:             Not well visualized    Upper             Appears normal
Extremities:
Aortic Arch:      Not well visualized

Other:  Technically difficult due to maternal habitus and fetal position.
Targeted Anatomy

Fetal Central Nervous System
Lat. Ventricles:
Cervix Uterus Adnexa

Cervix:       Open
Uterus:       No abnormality visualized.

Left Ovary:    Within normal limits.
Right Ovary:   Within normal limits.
Impression

Single IUP at 21w 0d
Cervical insufficiency
Limited views of the fetal face and heart obtained
The estimated fetal weight today is at the 31st %tile.
Posterior placenta
Normal amniotic fluid volume

No measurable cervix is appreciated.  The cervix appears to
be dilated (2-3 cm ).  The fetal lower extremities are at the
cervix.
Fetal membranes appear to be hour-glassing into the vagina
Recommendations

See separate consult note
Do not feel that the patient is a good candidate for rescue
cerclage given the extent of cervical dilation and hour-
glassing membranes

## 2016-03-02 LAB — OB RESULTS CONSOLE RPR: RPR: NONREACTIVE

## 2016-03-02 LAB — OB RESULTS CONSOLE ABO/RH: RH Type: POSITIVE

## 2016-03-02 LAB — OB RESULTS CONSOLE ANTIBODY SCREEN: Antibody Screen: NEGATIVE

## 2016-03-02 LAB — OB RESULTS CONSOLE RUBELLA ANTIBODY, IGM: Rubella: IMMUNE

## 2016-03-02 LAB — OB RESULTS CONSOLE GC/CHLAMYDIA
CHLAMYDIA, DNA PROBE: NEGATIVE
GC PROBE AMP, GENITAL: NEGATIVE

## 2016-03-02 LAB — OB RESULTS CONSOLE HEPATITIS B SURFACE ANTIGEN: Hepatitis B Surface Ag: NEGATIVE

## 2016-03-02 LAB — OB RESULTS CONSOLE HIV ANTIBODY (ROUTINE TESTING): HIV: NONREACTIVE

## 2016-03-07 NOTE — H&P (Signed)
Janice [name] is a 35 y.o. female , originally referred to me by Dr. Ernestina Penna, for infertility. She conceived from her first cycle of IVF, but lost a twin pregnancy at 16 weeks due to cervical insufficiency. After an FET cycle she has now conceived a singleton IUP with CRL consistent with 11 wk 4 d GA and (+) FCA noted. Patient would like to preserve her childbearing potential.  Pertinent Gynecological History: Menses: None Bleeding: None  Contraception: none DES exposure: denies Blood transfusions: none Sexually transmitted diseases: no past history Previous GYN Procedures: TVOR  Last mammogram: normal Last pap: normal  OB History: G2P0   Menstrual History: Menarche age: 42  No LMP recorded.    Past Medical History  Diagnosis Date  . Hypertension     takes methyldopa  . PCOS (polycystic ovarian syndrome)                     Past Surgical History  Procedure Laterality Date  . Laparoscopic cholecystectomy  2003  . Polypectomy  2016  . Cervical cerclage N/A 04/16/2015    Procedure: CERCLAGE CERVICAL;  Surgeon: Noland Fordyce, MD;  Location: [facility];  Service: Gynecology;  Laterality: N/A;             No family history on file. No hereditary disease.  No cancer of breast, ovary, uterus. No cutaneous leiomyomatosis or renal cell carcinoma.  Social History   Social History  . Marital Status: Single    Spouse Name: N/A  . Number of Children: N/A  . Years of Education: N/A   Occupational History  . Not on file.   Social History Main Topics  . Smoking status: Never Smoker   . Smokeless tobacco: Never Used  . Alcohol Use: No  . Drug Use: No  . Sexual Activity: Yes   Other Topics Concern  . Not on file   Social History Narrative    No Known Allergies  No current facility-administered medications on file prior to encounter.   Current Outpatient Prescriptions on File Prior to Encounter  Medication Sig Dispense Refill  . metFORMIN (GLUCOPHAGE) 1000 MG  tablet Take 1,000 mg by mouth 2 (two) times daily with a meal.       Review of Systems  Constitutional: Negative.   HENT: Negative.   Eyes: Negative.   Respiratory: Negative.   Cardiovascular: Negative.   Gastrointestinal: Negative.   Genitourinary: Negative.   Musculoskeletal: Negative.   Skin: Negative.   Neurological: Negative.   Endo/Heme/Allergies: Negative.   Psychiatric/Behavioral: Negative.      Physical Exam  There were no vitals taken for this visit. Constitutional: She is oriented to person, place, and time. She appears well-developed and well-nourished.  HENT:  Head: Normocephalic and atraumatic.  Nose: Nose normal.  Mouth/Throat: Oropharynx is clear and moist. No oropharyngeal exudate.  Eyes: Conjunctivae normal and EOM are normal. Pupils are equal, round, and reactive to light. No scleral icterus.  Neck: Normal range of motion. Neck supple. No tracheal deviation present. No thyromegaly present.  Cardiovascular: Normal rate.   Respiratory: Effort normal and breath sounds normal.  GI: Soft. Bowel sounds are normal. She exhibits no distension and no mass. There is no tenderness.  Lymphadenopathy:    She has no cervical adenopathy.  Neurological: She is alert and oriented to person, place, and time. She has normal reflexes.  Skin: Skin is warm.  Psychiatric: She has a normal mood and affect. Her behavior is normal. Judgment and thought  content normal.       Assessment/Plan:  EDC: 09/28/2016 by known day of conception. Pt conceived from 1st cycle of FET after she lost twins due to CI. She will get prenatal care from Dr. Cherly Hensenousins, who will be assisting with her TAC surgery.. I have had several discussions with pt about elective TVC vs TAC. Pt has been desiring TAC. Pt is scheduled for laparoscopy,TAC at 11-12 wk, [facility]. Benefits and risks of Transabdominal Cerclage were discussed with the patient and her family member again.   Bowel prep instructions were given.  All of patient's questions were answered.  She verbalized understanding.  She knows that she will need a cesarean delivery for her current and future pregnancies.

## 2016-03-08 ENCOUNTER — Other Ambulatory Visit: Payer: Self-pay | Admitting: Obstetrics and Gynecology

## 2016-03-08 NOTE — Patient Instructions (Addendum)
Your procedure is scheduled on:  Wednesday, March 14, 2016  [facility] at:  11:15 AM  Pick up the phone at the desk and dial [phone].  Call this number if you have problems the morning of surgery: [phone].  Remember: Do NOT eat food:  After Midnight Tuesday  Do NOT drink clear liquids after:  8:30 AM day of surgery  Take these medicines the morning of surgery with a SIP OF WATER:  Labetalol  Do NOT take evening dose of Metformin the night before surgery  Do NOT wear jewelry (body piercing), metal hair clips/bobby pins, make-up, or nail polish. Do NOT wear lotions, powders, or perfumes.  You may wear deodorant. Do NOT shave for 48 hours prior to surgery. Do NOT bring valuables to the hospital. Contacts, dentures, or bridgework may not be worn into surgery.  Leave suitcase in car.  After surgery it may be brought to your room.  For patients admitted to the hospital, checkout time is 11:00 AM the day of discharge.  Have a responsible adult drive you home and stay with you for 24 hours after your procedure

## 2016-03-09 ENCOUNTER — Encounter (HOSPITAL_COMMUNITY)
Admission: RE | Admit: 2016-03-09 | Discharge: 2016-03-09 | Disposition: A | Discharge status: Home / Self Care | Payer: BLUE CROSS/BLUE SHIELD | Source: Ambulatory Visit | Attending: Obstetrics and Gynecology | Admitting: Obstetrics and Gynecology

## 2016-03-09 ENCOUNTER — Encounter (HOSPITAL_COMMUNITY): Payer: Self-pay

## 2016-03-09 DIAGNOSIS — Z01812 Encounter for preprocedural laboratory examination: Secondary | ICD-10-CM | POA: Diagnosis not present

## 2016-03-09 HISTORY — DX: Personal history of other complications of pregnancy, childbirth and the puerperium: Z87.59

## 2016-03-09 HISTORY — DX: Anemia, unspecified: D64.9

## 2016-03-09 LAB — CBC
HEMATOCRIT: 35 % — AB (ref 36.0–46.0)
Hemoglobin: 11.5 g/dL — ABNORMAL LOW (ref 12.0–15.0)
MCH: 28 pg (ref 26.0–34.0)
MCHC: 32.9 g/dL (ref 30.0–36.0)
MCV: 85.2 fL (ref 78.0–100.0)
Platelets: 271 10*3/uL (ref 150–400)
RBC: 4.11 MIL/uL (ref 3.87–5.11)
RDW: 13.8 % (ref 11.5–15.5)
WBC: 7.2 10*3/uL (ref 4.0–10.5)

## 2016-03-09 LAB — COMPREHENSIVE METABOLIC PANEL
ALBUMIN: 3.4 g/dL — AB (ref 3.5–5.0)
ALT: 26 U/L (ref 14–54)
ANION GAP: 7 (ref 5–15)
AST: 19 U/L (ref 15–41)
Alkaline Phosphatase: 49 U/L (ref 38–126)
BUN: 14 mg/dL (ref 6–20)
CO2: 21 mmol/L — AB (ref 22–32)
Calcium: 9 mg/dL (ref 8.9–10.3)
Chloride: 107 mmol/L (ref 101–111)
Creatinine, Ser: 0.87 mg/dL (ref 0.44–1.00)
GFR calc Af Amer: >60 mL/min (ref >60)
GFR calc non Af Amer: >60 mL/min (ref >60)
GLUCOSE: 85 mg/dL (ref 65–99)
POTASSIUM: 3.7 mmol/L (ref 3.5–5.1)
SODIUM: 135 mmol/L (ref 135–145)
Total Bilirubin: 0.7 mg/dL (ref 0.3–1.2)
Total Protein: 7 g/dL (ref 6.5–8.1)

## 2016-03-09 LAB — URIC ACID: URIC ACID, SERUM: 5.2 mg/dL (ref 2.3–6.6)

## 2016-03-14 ENCOUNTER — Ambulatory Visit (HOSPITAL_COMMUNITY)
Admission: RE | Admit: 2016-03-14 | Discharge: 2016-03-14 | Disposition: A | Discharge status: Home / Self Care | Payer: BLUE CROSS/BLUE SHIELD | Source: Ambulatory Visit | Attending: Obstetrics and Gynecology | Admitting: Obstetrics and Gynecology

## 2016-03-14 ENCOUNTER — Ambulatory Visit (HOSPITAL_COMMUNITY): Payer: BLUE CROSS/BLUE SHIELD | Admitting: Anesthesiology

## 2016-03-14 ENCOUNTER — Encounter (HOSPITAL_COMMUNITY)
Admission: RE | Disposition: A | Discharge status: Home / Self Care | Payer: Self-pay | Source: Ambulatory Visit | Attending: Obstetrics and Gynecology

## 2016-03-14 ENCOUNTER — Encounter (HOSPITAL_COMMUNITY): Payer: Self-pay

## 2016-03-14 ENCOUNTER — Ambulatory Visit (HOSPITAL_COMMUNITY): Payer: BLUE CROSS/BLUE SHIELD

## 2016-03-14 DIAGNOSIS — O99211 Obesity complicating pregnancy, first trimester: Secondary | ICD-10-CM | POA: Diagnosis not present

## 2016-03-14 DIAGNOSIS — O3431 Maternal care for cervical incompetence, first trimester: Secondary | ICD-10-CM | POA: Insufficient documentation

## 2016-03-14 DIAGNOSIS — O3430 Maternal care for cervical incompetence, unspecified trimester: Secondary | ICD-10-CM

## 2016-03-14 DIAGNOSIS — Z3A12 12 weeks gestation of pregnancy: Secondary | ICD-10-CM | POA: Insufficient documentation

## 2016-03-14 DIAGNOSIS — Z6839 Body mass index (BMI) 39.0-39.9, adult: Secondary | ICD-10-CM | POA: Insufficient documentation

## 2016-03-14 DIAGNOSIS — O10011 Pre-existing essential hypertension complicating pregnancy, first trimester: Secondary | ICD-10-CM | POA: Insufficient documentation

## 2016-03-14 DIAGNOSIS — E282 Polycystic ovarian syndrome: Secondary | ICD-10-CM | POA: Diagnosis not present

## 2016-03-14 HISTORY — PX: CERCLAGE LAPAROSCOPIC ABDOMINAL: SHX5769

## 2016-03-14 SURGERY — CERCLAGE, CERVIX, LAPAROSCOPIC
Anesthesia: General | Site: Abdomen

## 2016-03-14 MED ORDER — INDOMETHACIN 50 MG RE SUPP
50.0000 mg | Freq: Once | RECTAL | Status: AC
  Administered 2016-03-14: 50 mg via RECTAL
  Filled 2016-03-14: qty 1

## 2016-03-14 MED ORDER — GLYCOPYRROLATE 0.2 MG/ML IJ SOLN
INTRAMUSCULAR | Status: AC
  Filled 2016-03-14: qty 2

## 2016-03-14 MED ORDER — LIDOCAINE HCL (CARDIAC) 20 MG/ML IV SOLN
INTRAVENOUS | Status: AC
  Filled 2016-03-14: qty 5

## 2016-03-14 MED ORDER — MIDAZOLAM HCL 2 MG/2ML IJ SOLN
INTRAMUSCULAR | Status: AC
  Filled 2016-03-14: qty 2

## 2016-03-14 MED ORDER — NEOSTIGMINE METHYLSULFATE 10 MG/10ML IV SOLN
INTRAVENOUS | Status: DC | PRN
  Administered 2016-03-14: 2 mg via INTRAVENOUS

## 2016-03-14 MED ORDER — SCOPOLAMINE 1 MG/3DAYS TD PT72: 1.0000 | MEDICATED_PATCH | Freq: Once | TRANSDERMAL | Status: DC

## 2016-03-14 MED ORDER — BUPIVACAINE HCL 0.25 % IJ SOLN
INTRAMUSCULAR | Status: DC | PRN
  Administered 2016-03-14: 10 mL
  Administered 2016-03-14: 7 mL
  Administered 2016-03-14: 5 mL
  Administered 2016-03-14: 10 mL
  Administered 2016-03-14: 3 mL

## 2016-03-14 MED ORDER — BUPIVACAINE HCL (PF) 0.25 % IJ SOLN
INTRAMUSCULAR | Status: AC
  Filled 2016-03-14: qty 30

## 2016-03-14 MED ORDER — OXYCODONE HCL 5 MG/5ML PO SOLN: 5.0000 mg | Freq: Once | ORAL | Status: DC | PRN

## 2016-03-14 MED ORDER — ROCURONIUM BROMIDE 100 MG/10ML IV SOLN
INTRAVENOUS | Status: DC | PRN
  Administered 2016-03-14: 5 mg via INTRAVENOUS
  Administered 2016-03-14: 10 mg via INTRAVENOUS
  Administered 2016-03-14: 40 mg via INTRAVENOUS
  Administered 2016-03-14: 5 mg via INTRAVENOUS

## 2016-03-14 MED ORDER — OXYCODONE-ACETAMINOPHEN 7.5-325 MG PO TABS: 1.0000 | ORAL_TABLET | ORAL | Status: DC | PRN

## 2016-03-14 MED ORDER — PROPOFOL 10 MG/ML IV BOLUS
INTRAVENOUS | Status: AC
  Filled 2016-03-14: qty 20

## 2016-03-14 MED ORDER — OXYCODONE HCL 5 MG PO TABS: 5.0000 mg | ORAL_TABLET | Freq: Once | ORAL | Status: DC | PRN

## 2016-03-14 MED ORDER — FENTANYL CITRATE (PF) 100 MCG/2ML IJ SOLN: 25.0000 ug | INTRAMUSCULAR | Status: DC | PRN

## 2016-03-14 MED ORDER — FENTANYL CITRATE (PF) 250 MCG/5ML IJ SOLN
INTRAMUSCULAR | Status: AC
  Filled 2016-03-14: qty 5

## 2016-03-14 MED ORDER — GLYCOPYRROLATE 0.2 MG/ML IJ SOLN
INTRAMUSCULAR | Status: DC | PRN
  Administered 2016-03-14: 0.4 mg via INTRAVENOUS

## 2016-03-14 MED ORDER — LACTATED RINGERS IR SOLN
Status: DC | PRN
  Administered 2016-03-14: 3000 mL

## 2016-03-14 MED ORDER — PHENYLEPHRINE 40 MCG/ML (10ML) SYRINGE FOR IV PUSH (FOR BLOOD PRESSURE SUPPORT)
PREFILLED_SYRINGE | INTRAVENOUS | Status: AC
  Filled 2016-03-14: qty 10

## 2016-03-14 MED ORDER — ONDANSETRON HCL 4 MG/2ML IJ SOLN
INTRAMUSCULAR | Status: AC
  Filled 2016-03-14: qty 2

## 2016-03-14 MED ORDER — NEOSTIGMINE METHYLSULFATE 10 MG/10ML IV SOLN
INTRAVENOUS | Status: AC
  Filled 2016-03-14: qty 1

## 2016-03-14 MED ORDER — SUGAMMADEX SODIUM 200 MG/2ML IV SOLN
INTRAVENOUS | Status: AC
  Filled 2016-03-14: qty 2

## 2016-03-14 MED ORDER — LIDOCAINE HCL (CARDIAC) 20 MG/ML IV SOLN
INTRAVENOUS | Status: DC | PRN
  Administered 2016-03-14: 80 mg via INTRAVENOUS

## 2016-03-14 MED ORDER — ROCURONIUM BROMIDE 100 MG/10ML IV SOLN
INTRAVENOUS | Status: AC
  Filled 2016-03-14: qty 1

## 2016-03-14 MED ORDER — PHENYLEPHRINE HCL 10 MG/ML IJ SOLN
INTRAMUSCULAR | Status: DC | PRN
  Administered 2016-03-14 (×2): 80 ug via INTRAVENOUS

## 2016-03-14 MED ORDER — CEFAZOLIN SODIUM-DEXTROSE 2-4 GM/100ML-% IV SOLN
2.0000 g | INTRAVENOUS | Status: AC
  Administered 2016-03-14: 2 g via INTRAVENOUS

## 2016-03-14 MED ORDER — PROPOFOL 10 MG/ML IV BOLUS
INTRAVENOUS | Status: DC | PRN
  Administered 2016-03-14: 190 mg via INTRAVENOUS

## 2016-03-14 MED ORDER — FENTANYL CITRATE (PF) 100 MCG/2ML IJ SOLN
INTRAMUSCULAR | Status: DC | PRN
  Administered 2016-03-14 (×2): 25 ug via INTRAVENOUS
  Administered 2016-03-14 (×2): 100 ug via INTRAVENOUS

## 2016-03-14 MED ORDER — LACTATED RINGERS IV SOLN
INTRAVENOUS | Status: DC
  Administered 2016-03-14: 14:00:00 via INTRAVENOUS
  Administered 2016-03-14: 1000 mL via INTRAVENOUS
  Administered 2016-03-14: 12:00:00 via INTRAVENOUS

## 2016-03-14 MED ORDER — ONDANSETRON HCL 4 MG PO TABS: 4.0000 mg | ORAL_TABLET | Freq: Three times a day (TID) | ORAL | Status: DC | PRN

## 2016-03-14 MED ORDER — INDOMETHACIN 25 MG PO CAPS: 25.0000 mg | ORAL_CAPSULE | Freq: Three times a day (TID) | ORAL | Status: AC

## 2016-03-14 SURGICAL SUPPLY — 47 items
BRR ADH 6X5 SEPRAFILM 1 SHT (MISCELLANEOUS)
CABLE HIGH FREQUENCY MONO STRZ (ELECTRODE) IMPLANT
CATH ROBINSON RED A/P 16FR (CATHETERS) IMPLANT
CLOTH BEACON ORANGE TIMEOUT ST (SAFETY) ×2 IMPLANT
DEVICE TROCAR PUNCTURE CLOSURE (ENDOMECHANICALS) ×4 IMPLANT
DRSG COVADERM PLUS 2X2 (GAUZE/BANDAGES/DRESSINGS) ×4 IMPLANT
DRSG OPSITE POSTOP 3X4 (GAUZE/BANDAGES/DRESSINGS) IMPLANT
ELECT NDL TIP 2.8 STRL (NEEDLE) IMPLANT
ELECT NEEDLE TIP 2.8 STRL (NEEDLE) IMPLANT
ELECT REM PT RETURN 9FT ADLT (ELECTROSURGICAL) ×2
ELECTRODE REM PT RTRN 9FT ADLT (ELECTROSURGICAL) ×1 IMPLANT
EVACUATOR SMOKE 8.L (FILTER) IMPLANT
GLOVE BIO SURGEON STRL SZ8 (GLOVE) ×2 IMPLANT
GLOVE BIOGEL PI IND STRL 7.0 (GLOVE) ×2 IMPLANT
GLOVE BIOGEL PI IND STRL 8.5 (GLOVE) ×1 IMPLANT
GLOVE BIOGEL PI INDICATOR 7.0 (GLOVE) ×2
GLOVE BIOGEL PI INDICATOR 8.5 (GLOVE) ×1
GOWN STRL REUS W/TWL LRG LVL3 (GOWN DISPOSABLE) ×4 IMPLANT
LIQUID BAND (GAUZE/BANDAGES/DRESSINGS) ×2 IMPLANT
NEEDLE INSUFFLATION 120MM (ENDOMECHANICALS) ×2 IMPLANT
PACK LAPAROSCOPY BASIN (CUSTOM PROCEDURE TRAY) ×2 IMPLANT
PENCIL BUTTON HOLSTER BLD 10FT (ELECTRODE) IMPLANT
POUCH LAPAROSCOPIC INSTRUMENT (MISCELLANEOUS) ×1 IMPLANT
RETRACTOR LAPSCP 12X46 CVD (ENDOMECHANICALS) ×1 IMPLANT
RTRCTR LAPSCP 12X46 CVD (ENDOMECHANICALS) ×2
SEALER TISSUE G2 CVD JAW 35 (ENDOMECHANICALS) IMPLANT
SEALER TISSUE G2 CVD JAW 45CM (ENDOMECHANICALS)
SEPRAFILM MEMBRANE 5X6 (MISCELLANEOUS) IMPLANT
SET IRRIG TUBING LAPAROSCOPIC (IRRIGATION / IRRIGATOR) ×2 IMPLANT
SHEARS HARMONIC ACE PLUS 36CM (ENDOMECHANICALS) ×2 IMPLANT
SLEEVE XCEL OPT CAN 5 100 (ENDOMECHANICALS) ×2 IMPLANT
SUT MERSILENE 5MM BP 1 12 (SUTURE) ×2 IMPLANT
SUT MON AB 4-0 PS1 27 (SUTURE) ×2 IMPLANT
SUT SILK 2 0 FSL 18 (SUTURE) ×4 IMPLANT
SUT VIC AB 2-0 UR6 27 (SUTURE) ×2 IMPLANT
SUT VICRYL 0 TIES 12 18 (SUTURE) IMPLANT
SYR 20CC LL (SYRINGE) IMPLANT
SYR 50ML LL SCALE MARK (SYRINGE) IMPLANT
SYR 50ML SLIP (SYRINGE) IMPLANT
SYR TOOMEY 50ML (SYRINGE) IMPLANT
TOWEL OR 17X24 6PK STRL BLUE (TOWEL DISPOSABLE) ×4 IMPLANT
TRAY FOLEY CATH SILVER 14FR (SET/KITS/TRAYS/PACK) ×2 IMPLANT
TROCAR OPTI TIP 5M 100M (ENDOMECHANICALS) ×2 IMPLANT
TROCAR XCEL 12X100 BLDLESS (ENDOMECHANICALS) ×2 IMPLANT
TROCAR XCEL DIL TIP R 11M (ENDOMECHANICALS) IMPLANT
WARMER LAPAROSCOPE (MISCELLANEOUS) ×2 IMPLANT
WATER STERILE IRR 1000ML POUR (IV SOLUTION) ×2 IMPLANT

## 2016-03-14 NOTE — OR Nursing (Signed)
U/S to room at 1413 for transvaginal ultrasound at Dr. Lyndal RainbowYalcinkaya's request

## 2016-03-14 NOTE — Anesthesia Postprocedure Evaluation (Signed)
Anesthesia Post Note  Patient: Janice [name]  Procedure(s) Performed: Procedure(s) (LRB): CERCLAGE LAPAROSCOPIC ABDOMINAL (N/A)  Patient location during evaluation: PACU Anesthesia Type: General Level of consciousness: awake Pain management: pain level controlled Vital Signs Assessment: post-procedure vital signs reviewed and stable Respiratory status: spontaneous breathing Cardiovascular status: stable Postop Assessment: no signs of nausea or vomiting Anesthetic complications: no     Last Vitals:  Filed Vitals:   03/14/16 1615 03/14/16 1630  BP: 155/111 157/110  Pulse: 63 63  Temp:    Resp: 15 14    Last Pain: There were no vitals filed for this visit. Pain Goal: Patients Stated Pain Goal: 3 (03/14/16 1118)               Janice [name],Janice [name]

## 2016-03-14 NOTE — Discharge Instructions (Signed)

## 2016-03-14 NOTE — Transfer of Care (Signed)
Immediate Anesthesia Transfer of Care Note  Patient: [name]  Procedure(s) Performed: Procedure(s): CERCLAGE LAPAROSCOPIC ABDOMINAL (N/A)  Patient Location: PACU  Anesthesia Type:General  Level of Consciousness: awake  Airway & Oxygen Therapy: Patient Spontanous Breathing  Post-op Assessment: Report given to PACU RN  Post vital signs: stable  Filed Vitals:   03/14/16 1118  BP: 131/98  Pulse: 82  Temp: 36.7 C  Resp: 20    Complications: No apparent anesthesia complications

## 2016-03-14 NOTE — Anesthesia Preprocedure Evaluation (Signed)
Anesthesia Evaluation  Patient identified by MRN, date of birth, ID band Patient awake    Reviewed: Allergy & Precautions, H&P , NPO status , Patient's Chart, lab work & pertinent test results  Airway Mallampati: I  TM Distance: >3 FB Neck ROM: full    Dental no notable dental hx.    Pulmonary neg pulmonary ROS,    Pulmonary exam normal        Cardiovascular hypertension, Normal cardiovascular exam     Neuro/Psych negative neurological ROS  negative psych ROS   GI/Hepatic negative GI ROS, Neg liver ROS,   Endo/Other  diabetesMorbid obesity  Renal/GU negative Renal ROS     Musculoskeletal   Abdominal (+) + obese,   Peds  Hematology   Anesthesia Other Findings   Reproductive/Obstetrics (+) Pregnancy                             Anesthesia Physical Anesthesia Plan  ASA: III  Anesthesia Plan: General   Post-op Pain Management:    Induction: Intravenous  Airway Management Planned: Oral ETT  Additional Equipment:   Intra-op Plan:   Post-operative Plan: Extubation in OR  Informed Consent: I have reviewed the patients History and Physical, chart, labs and discussed the procedure including the risks, benefits and alternatives for the proposed anesthesia with the patient or authorized representative who has indicated his/her understanding and acceptance.   Dental advisory given  Plan Discussed with: CRNA and Surgeon  Anesthesia Plan Comments:         Anesthesia Quick Evaluation

## 2016-03-14 NOTE — H&P (Signed)
History and Physical Interval Note:  03/14/2016 1:02 PM  Janice [name]  has presented today for surgery, with the diagnosis of INCOMPETENT CERVIX WITH HISTORY NEONATAL DEATH  The various methods of treatment have been discussed with the patient and family. After consideration of risks, benefits and other options for treatment, the patient has consented to  Procedure(s): CERCLAGE LAPAROSCOPIC ABDOMINAL (N/A) as a surgical intervention .  The patient's history has been reviewed, patient examined, no change in status, stable for surgery.  I have reviewed the patient's chart and labs.  Questions were answered to the patient's satisfaction.     Fermin SchwabYALCINKAYA,Eustace Hur

## 2016-03-14 NOTE — Anesthesia Procedure Notes (Signed)
Procedure Name: Intubation Date/Time: 03/14/2016 1:25 PM Performed by: Cephus ShellingBURGER, Uchenna Rappaport A Pre-anesthesia Checklist: Patient identified, Emergency Drugs available, Suction available and Patient being monitored Patient Re-evaluated:Patient Re-evaluated prior to inductionOxygen Delivery Method: Circle system utilized and Simple face mask Preoxygenation: Pre-oxygenation with 100% oxygen Intubation Type: IV induction Ventilation: Mask ventilation without difficulty Laryngoscope Size: Mac and 3 Grade View: Grade II Tube type: Oral Tube size: 7.0 mm Number of attempts: 1 Airway Equipment and Method: Stylet Placement Confirmation: ETT inserted through vocal cords under direct vision,  positive ETCO2 and breath sounds checked- equal and bilateral Secured at: 20 (right lip) cm Tube secured with: Tape Dental Injury: Teeth and Oropharynx as per pre-operative assessment

## 2016-03-14 NOTE — Op Note (Addendum)
OPERATIVE NOTE  Preoperative diagnosis: Cervical insufficiency, intrauterine pregnancy at 12 weeks Postoperative diagnosis: Cervical insufficiency, intrauterine pregnancy at 12 weeks Procedure: Laparoscopy, transabdominal cervical isthmic cerclage, intraoperative ultrasound guidance Anesthesia: Gen. Endotracheal Surgeon:Rhylynn Perdomo April Manson, MD Assistant: Maxie Better, MD Complications: None  Estimated blood loss: 100 cc Findings: On exam under anesthesia the cervix was closed. On laparoscopy the liver edges and appendix were normal. The uterus was gravid both ovaries appeared polycystic. Intraoperative ultrasound  transvaginally showed a singleton intrauterine pregnancy. There was fetal cardiac activity and movements.  Description of the procedure: The patient was placed in dorsal supine position and general endotracheal anesthesia was given. She was then placed in lithotomy position and the abdomen was prepped and draped inside manner. 50 mg suppository of indomethacin was placed rectally. A Foley catheter was inserted into the bladder. An intraumbilical 5 mm vertical skin incision was made after preemptive anesthesia of all incisions with 0.25% bupivacaine. A Verress needle was inserted. A pneumoperitoneum was created with carbon dioxide. A 5 mm trocar and then a corresponding laparoscope with 30 angle was inserted and video laparoscopy was started.  Under direct visualization 2 more 5 mm lower quadrant incisions were made and corresponding trochars were placed. A fifth 12 mm trocar was placed in the left upper quadrant for the Endopaddle retractor.  Above findings were noted. A #5 Mersilene tape was prepared by cutting the needles off just at the swaged and and creating a loop of 2-0 silk at each end to allow carrying the end of the Mersilene tape through the tissue during the cerclage. This Mersilene tape was then dropped into the posterior cul-de-sac. The patient was placed in Trendelenburg  position and the uterus was gently pressed posteriorly and superiorly with the EndoPaddle retractor while the bladder flap was created with Harmonic Ace. After blunt and sharp dissection the cervico-isthmic junction could be well visualized. Next a stab wound incision was made 1 cm from the midline immediately suprapubically and an Endo Close ligature carrier was passed through the abdominal wall and pain and at the right side of the cervical isthmic junction. At this point of passing the tip of the Endo Close through the parametrium the uterus was hammocked on the EndoPaddle device and lifted anteriorly, allowing visualization of the posterior lower uterine segment. The tip of the Endo Close device was brought out posteriorly just above the medial insertion point of the right uterosacral ligaments onto the uterus. One end of the Mersilene tape was grasped with the Endo Close device and brought out anteriorly. The same steps were repeated with a new Endo Close device on the left anterior aspect of the cervico-isthmic junction, immediately medial to the uterine blood vessels, except that we had to repeat it a few times because the epiploica and the left ovary were blocking the posterior culdesac view. Finally the other end of the Mersilene tape was grasped with the ligature carrier device and brought out anteriorly. Intraoperative transvaginal ultrasound guidance confirmed correct extra chorionic placement of the cerclage, as well as the fetal viability, even though he noted that the right side of the stitch was about a centimeter higher, incorporating a portion of myometrium. When the cerclage suture was tied with a surgeon's knot followed by 5 additional knots under transvaginal ultrasound guidance again the correct placement was confirmed. The resulting cervical cerclage produced a cervical length of 3.1 cm. The AP and transverse diameters of the cerclage by ultrasound was 19 and 20 mm respectively. The ends of  the  Mersilene tape was trimmed off and removed from the pelvis. Good hemostasis was insured. The pelvis was copiously irrigated and aspirated. The gas was allowed to escape. All skin incisions were approximated with Dermabond. The patient tolerated the procedure well and was transferred to recovery room in satisfactory condition.  Fermin SchwabYALCINKAYA,Dennis Hegeman, MD

## 2016-03-15 ENCOUNTER — Encounter (HOSPITAL_COMMUNITY): Payer: Self-pay | Admitting: Obstetrics and Gynecology

## 2016-07-23 ENCOUNTER — Other Ambulatory Visit: Payer: Self-pay | Admitting: Obstetrics and Gynecology

## 2016-08-28 ENCOUNTER — Encounter (HOSPITAL_COMMUNITY): Payer: Self-pay | Admitting: *Deleted

## 2016-08-28 ENCOUNTER — Telehealth (HOSPITAL_COMMUNITY): Payer: Self-pay | Admitting: *Deleted

## 2016-08-28 NOTE — Telephone Encounter (Signed)
Preadmission screen  

## 2016-09-05 ENCOUNTER — Encounter (HOSPITAL_COMMUNITY)
Admission: RE | Admit: 2016-09-05 | Discharge: 2016-09-05 | Disposition: A | Discharge status: Home / Self Care | Payer: BLUE CROSS/BLUE SHIELD | Source: Ambulatory Visit

## 2016-09-10 NOTE — Pre-Procedure Instructions (Signed)
Preop instructions completed over the phone on 09/10/2016 at 11:40.  Instructions emailed to verified pt email address for back up.

## 2016-09-10 NOTE — Patient Instructions (Signed)
18 Janice GallusKendra R [name]  09/10/2016   Your procedure is scheduled on:  09/14/2016  [facility] through [facility] at 1:30 PM.  .   Call this number if you have problems the morning of surgery: [phone]   Remember:   Do not eat food:After Midnight.  Do not drink clear liquids: 4 Hours before arrival.  Take these medicines the morning of surgery with A SIP OF WATER: labetolol morning dose ONLY   Do not wear jewelry, make-up or nail polish.  Do not wear lotions, powders, or perfumes. Do not wear deodorant.  Do not shave 48 hours prior to surgery.  Do not bring valuables to the hospital.  [facility] is not   responsible for any belongings or valuables brought to the hospital.  Contacts, dentures or bridgework may not be worn into surgery.  Leave suitcase in the car. After surgery it may be brought to your room.  For patients admitted to the hospital, checkout time is 11:00 AM the day of              discharge.   Patients discharged the day of surgery will not be allowed to drive             home.  Name and phone number of your driver: na  Special Instructions:   N/A   Please read over the following fact sheets that you were given:   Surgical Site Infection Prevention

## 2016-09-14 ENCOUNTER — Encounter (HOSPITAL_COMMUNITY)
Admission: RE | Disposition: A | Discharge status: Home / Self Care | Payer: Self-pay | Source: Ambulatory Visit | Attending: Obstetrics and Gynecology

## 2016-09-14 ENCOUNTER — Inpatient Hospital Stay (HOSPITAL_COMMUNITY)
Admission: RE | Admit: 2016-09-14 | Discharge: 2016-09-17 | DRG: 765 | Disposition: A | Discharge status: Home / Self Care | Payer: BLUE CROSS/BLUE SHIELD | Source: Ambulatory Visit | Attending: Obstetrics and Gynecology | Admitting: Obstetrics and Gynecology

## 2016-09-14 ENCOUNTER — Encounter (HOSPITAL_COMMUNITY): Payer: Self-pay

## 2016-09-14 ENCOUNTER — Inpatient Hospital Stay (HOSPITAL_COMMUNITY): Payer: BLUE CROSS/BLUE SHIELD | Admitting: Anesthesiology

## 2016-09-14 DIAGNOSIS — O10919 Unspecified pre-existing hypertension complicating pregnancy, unspecified trimester: Secondary | ICD-10-CM | POA: Diagnosis present

## 2016-09-14 DIAGNOSIS — O99214 Obesity complicating childbirth: Secondary | ICD-10-CM | POA: Diagnosis present

## 2016-09-14 DIAGNOSIS — Z3A38 38 weeks gestation of pregnancy: Secondary | ICD-10-CM | POA: Diagnosis not present

## 2016-09-14 DIAGNOSIS — Z6841 Body Mass Index (BMI) 40.0 and over, adult: Secondary | ICD-10-CM | POA: Diagnosis not present

## 2016-09-14 DIAGNOSIS — O3433 Maternal care for cervical incompetence, third trimester: Secondary | ICD-10-CM | POA: Diagnosis present

## 2016-09-14 DIAGNOSIS — O1002 Pre-existing essential hypertension complicating childbirth: Principal | ICD-10-CM | POA: Diagnosis present

## 2016-09-14 DIAGNOSIS — O10913 Unspecified pre-existing hypertension complicating pregnancy, third trimester: Secondary | ICD-10-CM | POA: Diagnosis present

## 2016-09-14 LAB — BASIC METABOLIC PANEL
ANION GAP: 6 (ref 5–15)
BUN: 18 mg/dL (ref 6–20)
CHLORIDE: 111 mmol/L (ref 101–111)
CO2: 20 mmol/L — ABNORMAL LOW (ref 22–32)
Calcium: 8.9 mg/dL (ref 8.9–10.3)
Creatinine, Ser: 0.84 mg/dL (ref 0.44–1.00)
GFR calc Af Amer: >60 mL/min (ref >60)
Glucose, Bld: 73 mg/dL (ref 65–99)
POTASSIUM: 4 mmol/L (ref 3.5–5.1)
SODIUM: 137 mmol/L (ref 135–145)

## 2016-09-14 LAB — CBC
HEMATOCRIT: 35 % — AB (ref 36.0–46.0)
HEMOGLOBIN: 11.4 g/dL — AB (ref 12.0–15.0)
MCH: 28.9 pg (ref 26.0–34.0)
MCHC: 32.6 g/dL (ref 30.0–36.0)
MCV: 88.6 fL (ref 78.0–100.0)
Platelets: 225 10*3/uL (ref 150–400)
RBC: 3.95 MIL/uL (ref 3.87–5.11)
RDW: 14.9 % (ref 11.5–15.5)
WBC: 8 10*3/uL (ref 4.0–10.5)

## 2016-09-14 LAB — PREPARE RBC (CROSSMATCH)

## 2016-09-14 SURGERY — Surgical Case
Anesthesia: Spinal

## 2016-09-14 MED ORDER — HYDROMORPHONE HCL 1 MG/ML IJ SOLN: 0.2500 mg | INTRAMUSCULAR | Status: DC | PRN

## 2016-09-14 MED ORDER — KETOROLAC TROMETHAMINE 30 MG/ML IJ SOLN: 30.0000 mg | Freq: Once | INTRAMUSCULAR | Status: DC

## 2016-09-14 MED ORDER — MEPERIDINE HCL 25 MG/ML IJ SOLN: 6.2500 mg | INTRAMUSCULAR | Status: DC | PRN

## 2016-09-14 MED ORDER — LACTATED RINGERS IV SOLN
INTRAVENOUS | Status: DC | PRN
  Administered 2016-09-14: 40 [IU] via INTRAVENOUS

## 2016-09-14 MED ORDER — KETOROLAC TROMETHAMINE 30 MG/ML IJ SOLN
INTRAMUSCULAR | Status: AC
  Filled 2016-09-14: qty 1

## 2016-09-14 MED ORDER — SCOPOLAMINE 1 MG/3DAYS TD PT72
MEDICATED_PATCH | TRANSDERMAL | Status: AC
  Administered 2016-09-14: 1.5 mg via TRANSDERMAL
  Filled 2016-09-14: qty 1

## 2016-09-14 MED ORDER — NALBUPHINE HCL 10 MG/ML IJ SOLN: 5.0000 mg | Freq: Once | INTRAMUSCULAR | Status: DC | PRN

## 2016-09-14 MED ORDER — ONDANSETRON HCL 4 MG/2ML IJ SOLN: 4.0000 mg | Freq: Three times a day (TID) | INTRAMUSCULAR | Status: DC | PRN

## 2016-09-14 MED ORDER — ONDANSETRON HCL 4 MG/2ML IJ SOLN
INTRAMUSCULAR | Status: DC | PRN
  Administered 2016-09-14: 4 mg via INTRAVENOUS

## 2016-09-14 MED ORDER — NALBUPHINE HCL 10 MG/ML IJ SOLN: 5.0000 mg | INTRAMUSCULAR | Status: DC | PRN

## 2016-09-14 MED ORDER — CEFAZOLIN SODIUM-DEXTROSE 2-4 GM/100ML-% IV SOLN
2.0000 g | INTRAVENOUS | Status: AC
  Administered 2016-09-14: 2 g via INTRAVENOUS

## 2016-09-14 MED ORDER — LACTATED RINGERS IV SOLN
INTRAVENOUS | Status: DC
  Administered 2016-09-14 (×3): via INTRAVENOUS

## 2016-09-14 MED ORDER — SCOPOLAMINE 1 MG/3DAYS TD PT72
1.0000 | MEDICATED_PATCH | Freq: Once | TRANSDERMAL | Status: AC
  Administered 2016-09-14: 1.5 mg via TRANSDERMAL

## 2016-09-14 MED ORDER — SODIUM CHLORIDE 0.9% FLUSH: 3.0000 mL | INTRAVENOUS | Status: DC | PRN

## 2016-09-14 MED ORDER — ONDANSETRON HCL 4 MG/2ML IJ SOLN
INTRAMUSCULAR | Status: AC
  Filled 2016-09-14: qty 2

## 2016-09-14 MED ORDER — BUPIVACAINE HCL (PF) 0.25 % IJ SOLN
INTRAMUSCULAR | Status: AC
  Filled 2016-09-14: qty 20

## 2016-09-14 MED ORDER — PHENYLEPHRINE 8 MG IN D5W 100 ML (0.08MG/ML) PREMIX OPTIME
INJECTION | INTRAVENOUS | Status: AC
  Filled 2016-09-14: qty 100

## 2016-09-14 MED ORDER — DIPHENHYDRAMINE HCL 50 MG/ML IJ SOLN: 12.5000 mg | INTRAMUSCULAR | Status: DC | PRN

## 2016-09-14 MED ORDER — MORPHINE SULFATE-NACL 0.5-0.9 MG/ML-% IV SOSY
PREFILLED_SYRINGE | INTRAVENOUS | Status: DC | PRN
  Administered 2016-09-14: .2 mg via INTRATHECAL

## 2016-09-14 MED ORDER — PHENYLEPHRINE 40 MCG/ML (10ML) SYRINGE FOR IV PUSH (FOR BLOOD PRESSURE SUPPORT)
PREFILLED_SYRINGE | INTRAVENOUS | Status: DC | PRN
  Administered 2016-09-14 (×3): 80 ug via INTRAVENOUS
  Administered 2016-09-14: 40 ug via INTRAVENOUS

## 2016-09-14 MED ORDER — SCOPOLAMINE 1 MG/3DAYS TD PT72: 1.0000 | MEDICATED_PATCH | Freq: Once | TRANSDERMAL | Status: DC

## 2016-09-14 MED ORDER — FENTANYL CITRATE (PF) 100 MCG/2ML IJ SOLN
INTRAMUSCULAR | Status: AC
  Filled 2016-09-14: qty 2

## 2016-09-14 MED ORDER — PHENYLEPHRINE 8 MG IN D5W 100 ML (0.08MG/ML) PREMIX OPTIME
INJECTION | INTRAVENOUS | Status: DC | PRN
  Administered 2016-09-14: 60 ug/min via INTRAVENOUS

## 2016-09-14 MED ORDER — MORPHINE SULFATE-NACL 0.5-0.9 MG/ML-% IV SOSY
PREFILLED_SYRINGE | INTRAVENOUS | Status: AC
  Filled 2016-09-14: qty 1

## 2016-09-14 MED ORDER — KETOROLAC TROMETHAMINE 30 MG/ML IJ SOLN: 30.0000 mg | Freq: Four times a day (QID) | INTRAMUSCULAR | Status: DC | PRN

## 2016-09-14 MED ORDER — IBUPROFEN 600 MG PO TABS
600.0000 mg | ORAL_TABLET | Freq: Four times a day (QID) | ORAL | Status: DC | PRN
  Administered 2016-09-15: 600 mg via ORAL
  Filled 2016-09-14: qty 1

## 2016-09-14 MED ORDER — SODIUM CHLORIDE 0.9 % IR SOLN
Status: DC | PRN
  Administered 2016-09-14: 1

## 2016-09-14 MED ORDER — PHENYLEPHRINE 40 MCG/ML (10ML) SYRINGE FOR IV PUSH (FOR BLOOD PRESSURE SUPPORT)
PREFILLED_SYRINGE | INTRAVENOUS | Status: AC
  Filled 2016-09-14: qty 10

## 2016-09-14 MED ORDER — BUPIVACAINE HCL (PF) 0.25 % IJ SOLN
INTRAMUSCULAR | Status: DC | PRN
  Administered 2016-09-14: 10 mL

## 2016-09-14 MED ORDER — KETOROLAC TROMETHAMINE 30 MG/ML IJ SOLN
30.0000 mg | Freq: Four times a day (QID) | INTRAMUSCULAR | Status: DC | PRN
  Administered 2016-09-14: 30 mg via INTRAMUSCULAR

## 2016-09-14 MED ORDER — ACETAMINOPHEN 500 MG PO TABS: 1000.0000 mg | ORAL_TABLET | Freq: Four times a day (QID) | ORAL | Status: DC

## 2016-09-14 MED ORDER — LACTATED RINGERS IV SOLN
INTRAVENOUS | Status: DC
  Administered 2016-09-14: 14:00:00 via INTRAVENOUS

## 2016-09-14 MED ORDER — DIPHENHYDRAMINE HCL 25 MG PO CAPS: 25.0000 mg | ORAL_CAPSULE | ORAL | Status: DC | PRN

## 2016-09-14 MED ORDER — FENTANYL CITRATE (PF) 100 MCG/2ML IJ SOLN
INTRAMUSCULAR | Status: DC | PRN
  Administered 2016-09-14: 20 ug via INTRATHECAL

## 2016-09-14 MED ORDER — NALOXONE HCL 0.4 MG/ML IJ SOLN: 0.4000 mg | INTRAMUSCULAR | Status: DC | PRN

## 2016-09-14 MED ORDER — NALOXONE HCL 2 MG/2ML IJ SOSY
1.0000 ug/kg/h | PREFILLED_SYRINGE | INTRAVENOUS | Status: DC | PRN
  Filled 2016-09-14: qty 2

## 2016-09-14 MED ORDER — OXYTOCIN 10 UNIT/ML IJ SOLN
INTRAMUSCULAR | Status: AC
  Filled 2016-09-14: qty 4

## 2016-09-14 MED ORDER — PROMETHAZINE HCL 25 MG/ML IJ SOLN: 6.2500 mg | INTRAMUSCULAR | Status: DC | PRN

## 2016-09-14 MED ORDER — BUPIVACAINE IN DEXTROSE 0.75-8.25 % IT SOLN
INTRATHECAL | Status: DC | PRN
  Administered 2016-09-14: 12 mg via INTRATHECAL

## 2016-09-14 SURGICAL SUPPLY — 50 items
APL SKNCLS STERI-STRIP NONHPOA (GAUZE/BANDAGES/DRESSINGS) ×1
BARRIER ADHS 3X4 INTERCEED (GAUZE/BANDAGES/DRESSINGS) ×3 IMPLANT
BENZOIN TINCTURE PRP APPL 2/3 (GAUZE/BANDAGES/DRESSINGS) ×3 IMPLANT
BRR ADH 4X3 ABS CNTRL BYND (GAUZE/BANDAGES/DRESSINGS) ×1
CHLORAPREP W/TINT 26ML (MISCELLANEOUS) ×3 IMPLANT
CLAMP CORD UMBIL (MISCELLANEOUS) IMPLANT
CLOSURE STERI STRIP 1/2 X4 (GAUZE/BANDAGES/DRESSINGS) ×2 IMPLANT
CLOSURE WOUND 1/2 X4 (GAUZE/BANDAGES/DRESSINGS)
CLOTH BEACON ORANGE TIMEOUT ST (SAFETY) ×3 IMPLANT
CONTAINER PREFILL 10% NBF 15ML (MISCELLANEOUS) IMPLANT
DECANTER SPIKE VIAL GLASS SM (MISCELLANEOUS) ×2 IMPLANT
DRAPE C SECTION CLR SCREEN (DRAPES) ×3 IMPLANT
DRSG OPSITE POSTOP 4X10 (GAUZE/BANDAGES/DRESSINGS) ×3 IMPLANT
DRSG TEGADERM 4X4.75 (GAUZE/BANDAGES/DRESSINGS) ×4 IMPLANT
ELECT REM PT RETURN 9FT ADLT (ELECTROSURGICAL) ×3
ELECTRODE REM PT RTRN 9FT ADLT (ELECTROSURGICAL) ×1 IMPLANT
EXTRACTOR VACUUM M CUP 4 TUBE (SUCTIONS) IMPLANT
EXTRACTOR VACUUM M CUP 4' TUBE (SUCTIONS)
GLOVE BIOGEL PI IND STRL 7.0 (GLOVE) ×2 IMPLANT
GLOVE BIOGEL PI INDICATOR 7.0 (GLOVE) ×4
GLOVE ECLIPSE 6.5 STRL STRAW (GLOVE) ×3 IMPLANT
GOWN STRL REUS W/TWL LRG LVL3 (GOWN DISPOSABLE) ×6 IMPLANT
KIT ABG SYR 3ML LUER SLIP (SYRINGE) IMPLANT
NDL HYPO 25X5/8 SAFETYGLIDE (NEEDLE) IMPLANT
NEEDLE HYPO 22GX1.5 SAFETY (NEEDLE) ×3 IMPLANT
NEEDLE HYPO 25X5/8 SAFETYGLIDE (NEEDLE) IMPLANT
NS IRRIG 1000ML POUR BTL (IV SOLUTION) ×3 IMPLANT
PACK C SECTION WH (CUSTOM PROCEDURE TRAY) ×3 IMPLANT
PAD OB MATERNITY 4.3X12.25 (PERSONAL CARE ITEMS) ×3 IMPLANT
RTRCTR C-SECT PINK 25CM LRG (MISCELLANEOUS) IMPLANT
SPONGE LAP 18X18 X RAY DECT (DISPOSABLE) ×4 IMPLANT
STRIP CLOSURE SKIN 1/2X4 (GAUZE/BANDAGES/DRESSINGS) IMPLANT
SUT CHROMIC GUT AB #0 18 (SUTURE) IMPLANT
SUT MNCRL 0 VIOLET CTX 36 (SUTURE) ×3 IMPLANT
SUT MON AB 2-0 SH 27 (SUTURE)
SUT MON AB 2-0 SH27 (SUTURE) IMPLANT
SUT MON AB 3-0 SH 27 (SUTURE)
SUT MON AB 3-0 SH27 (SUTURE) IMPLANT
SUT MON AB 4-0 PS1 27 (SUTURE) IMPLANT
SUT MONOCRYL 0 CTX 36 (SUTURE) ×6
SUT PLAIN 2 0 (SUTURE)
SUT PLAIN 2 0 XLH (SUTURE) ×2 IMPLANT
SUT PLAIN ABS 2-0 CT1 27XMFL (SUTURE) IMPLANT
SUT VIC AB 0 CT1 36 (SUTURE) ×6 IMPLANT
SUT VIC AB 2-0 CT1 27 (SUTURE) ×3
SUT VIC AB 2-0 CT1 TAPERPNT 27 (SUTURE) ×1 IMPLANT
SUT VIC AB 4-0 PS2 27 (SUTURE) IMPLANT
SYR CONTROL 10ML LL (SYRINGE) ×3 IMPLANT
TOWEL OR 17X24 6PK STRL BLUE (TOWEL DISPOSABLE) ×3 IMPLANT
TRAY FOLEY CATH SILVER 14FR (SET/KITS/TRAYS/PACK) IMPLANT

## 2016-09-14 NOTE — Brief Op Note (Signed)
09/14/2016  6:32 PM  PATIENT:  Audrie GallusKendra R Foulkes  35 y.o. female  PRE-OPERATIVE DIAGNOSIS:  Abdominal Cerclage, Chronic Hypertension, term gestation  POST-OPERATIVE DIAGNOSIS:  Chronic HTN in pregnancy, term gestation, abdominal cerclage  PROCEDURE:  Primary cesarean section, kerr hysterotomy  SURGEON:  Surgeon(s) and Role:    * Maxie BetterSheronette Triana Coover, MD - Primary  PHYSICIAN ASSISTANT:   ASSISTANTS: Raelyn Moraolitta Dawson, CNM   ANESTHESIA:   spinal FINDINGS;  LIVE FEMALE, nl tubes and ovaries,  EBL:  Total I/O In: 3300 [I.V.:3300] Out: 1000 [Urine:200; Blood:800]  BLOOD ADMINISTERED:none  DRAINS: none   LOCAL MEDICATIONS USED:  MARCAINE     SPECIMEN:  Source of Specimen:  placenta  DISPOSITION OF SPECIMEN:  PATHOLOGY  COUNTS:  YES  TOURNIQUET:  * No tourniquets in log *  DICTATION: .Other Dictation: Dictation Number [id number]  PLAN OF CARE: Admit to inpatient   PATIENT DISPOSITION:  PACU - hemodynamically stable.   Delay start of Pharmacological VTE agent (>24hrs) due to surgical blood loss or risk of bleeding: no

## 2016-09-14 NOTE — Anesthesia Postprocedure Evaluation (Signed)
Anesthesia Post Note  Patient: Janice [name]  Procedure(s) Performed: Procedure(s) (LRB): Primary CESAREAN SECTION (N/A)  Patient location during evaluation: PACU Anesthesia Type: Spinal Level of consciousness: awake and alert Pain management: pain level controlled Vital Signs Assessment: post-procedure vital signs reviewed and stable Respiratory status: spontaneous breathing and respiratory function stable Cardiovascular status: blood pressure returned to baseline and stable Postop Assessment: no headache, no backache and spinal receding Anesthetic complications: no     Last Vitals:  Vitals:   09/14/16 1908 09/14/16 1915  BP:  (!) 123/96  Pulse: (!) 57 (!) 53  Resp: 14 (!) 21  Temp:  37.2 C    Last Pain:  Vitals:   09/14/16 1915  TempSrc: Oral   Pain Goal: Patients Stated Pain Goal: 0 (09/14/16 1828)               Phillips Groutarignan, Jadynn Epping

## 2016-09-14 NOTE — Transfer of Care (Signed)
Immediate Anesthesia Transfer of Care Note  Patient: Janice [name]  Procedure(s) Performed: Procedure(s): Primary CESAREAN SECTION (N/A)  Patient Location: PACU  Anesthesia Type:Spinal  Level of Consciousness: awake  Airway & Oxygen Therapy: Patient Spontanous Breathing  Post-op Assessment: Report given to RN  Post vital signs: Reviewed and stable  Last Vitals:  Vitals:   09/14/16 1341  BP: 118/78  Pulse: 69  Resp: (!) 22  Temp: 36.6 C    Last Pain:  Vitals:   09/14/16 1341  TempSrc: Oral      Patients Stated Pain Goal: 3 (09/14/16 1341)  Complications: No apparent anesthesia complications

## 2016-09-14 NOTE — Anesthesia Procedure Notes (Signed)
Spinal  Patient location during procedure: OR Start time: 09/14/2016 4:46 PM End time: 09/14/2016 4:51 PM Staffing Anesthesiologist: Leilani AbleHATCHETT, Trevante Tennell Performed: anesthesiologist  Preanesthetic Checklist Completed: patient identified, surgical consent, pre-op evaluation, timeout performed, IV checked, risks and benefits discussed and monitors and equipment checked Spinal Block Patient position: sitting Prep: site prepped and draped and DuraPrep Patient monitoring: heart rate, cardiac monitor, continuous pulse ox and blood pressure Approach: midline Location: L3-4 Injection technique: single-shot Needle Needle type: Sprotte and Pencan  Needle gauge: 24 G Needle length: 9 cm Needle insertion depth: 9 cm Assessment Sensory level: T4

## 2016-09-14 NOTE — Anesthesia Preprocedure Evaluation (Signed)
Anesthesia Evaluation  Patient identified by MRN, date of birth, ID band Patient awake    Reviewed: Allergy & Precautions, H&P , NPO status , Patient's Chart, lab work & pertinent test results  Airway Mallampati: II  TM Distance: >3 FB Neck ROM: full    Dental no notable dental hx.    Pulmonary neg pulmonary ROS,    Pulmonary exam normal        Cardiovascular hypertension, negative cardio ROS Normal cardiovascular exam     Neuro/Psych TIAnegative psych ROS   GI/Hepatic negative GI ROS, Neg liver ROS,   Endo/Other  Morbid obesity  Renal/GU negative Renal ROS     Musculoskeletal   Abdominal (+) + obese,   Peds  Hematology   Anesthesia Other Findings   Reproductive/Obstetrics (+) Pregnancy                             Anesthesia Physical Anesthesia Plan  ASA: III  Anesthesia Plan: Spinal   Post-op Pain Management:    Induction:   Airway Management Planned:   Additional Equipment:   Intra-op Plan:   Post-operative Plan:   Informed Consent: I have reviewed the patients History and Physical, chart, labs and discussed the procedure including the risks, benefits and alternatives for the proposed anesthesia with the patient or authorized representative who has indicated his/her understanding and acceptance.     Plan Discussed with: CRNA and Surgeon  Anesthesia Plan Comments:         Anesthesia Quick Evaluation

## 2016-09-15 ENCOUNTER — Encounter (HOSPITAL_COMMUNITY): Payer: Self-pay | Admitting: Obstetrics and Gynecology

## 2016-09-15 DIAGNOSIS — O10919 Unspecified pre-existing hypertension complicating pregnancy, unspecified trimester: Secondary | ICD-10-CM | POA: Diagnosis present

## 2016-09-15 LAB — SYPHILIS: RPR W/REFLEX TO RPR TITER AND TREPONEMAL ANTIBODIES, TRADITIONAL SCREENING AND DIAGNOSIS ALGORITHM: RPR Ser Ql: NONREACTIVE

## 2016-09-15 MED ORDER — DIBUCAINE 1 % RE OINT: 1.0000 "application " | TOPICAL_OINTMENT | RECTAL | Status: DC | PRN

## 2016-09-15 MED ORDER — SIMETHICONE 80 MG PO CHEW
80.0000 mg | CHEWABLE_TABLET | ORAL | Status: DC | PRN
  Administered 2016-09-15 – 2016-09-16 (×5): 80 mg via ORAL
  Filled 2016-09-15 (×5): qty 1

## 2016-09-15 MED ORDER — SENNOSIDES-DOCUSATE SODIUM 8.6-50 MG PO TABS
2.0000 | ORAL_TABLET | ORAL | Status: DC
  Administered 2016-09-16: 1 via ORAL
  Administered 2016-09-16: 2 via ORAL
  Administered 2016-09-16: 1 via ORAL
  Filled 2016-09-15 (×3): qty 2

## 2016-09-15 MED ORDER — BENZOCAINE-MENTHOL 20-0.5 % EX AERO: 1.0000 "application " | INHALATION_SPRAY | CUTANEOUS | Status: DC | PRN

## 2016-09-15 MED ORDER — WITCH HAZEL-GLYCERIN EX PADS: 1.0000 | MEDICATED_PAD | CUTANEOUS | Status: DC | PRN

## 2016-09-15 MED ORDER — COCONUT OIL OIL: 1.0000 "application " | TOPICAL_OIL | Status: DC | PRN

## 2016-09-15 MED ORDER — INFLUENZA VAC SPLIT QUAD 0.5 ML IM SUSY
0.5000 mL | PREFILLED_SYRINGE | INTRAMUSCULAR | Status: AC
  Administered 2016-09-17: 0.5 mL via INTRAMUSCULAR

## 2016-09-15 MED ORDER — IBUPROFEN 600 MG PO TABS
600.0000 mg | ORAL_TABLET | Freq: Four times a day (QID) | ORAL | Status: DC
  Administered 2016-09-15 – 2016-09-17 (×9): 600 mg via ORAL
  Filled 2016-09-15 (×9): qty 1

## 2016-09-15 MED ORDER — OXYCODONE-ACETAMINOPHEN 5-325 MG PO TABS
1.0000 | ORAL_TABLET | ORAL | Status: DC | PRN
  Administered 2016-09-15 – 2016-09-17 (×7): 1 via ORAL
  Filled 2016-09-15 (×7): qty 1

## 2016-09-15 MED ORDER — ACETAMINOPHEN 325 MG PO TABS
650.0000 mg | ORAL_TABLET | ORAL | Status: DC | PRN
Start: 2016-09-15 — End: 2016-09-17

## 2016-09-15 NOTE — Progress Notes (Addendum)
RN called mid wife to enter Postpartum orders in . Rn called midwife back to enter order set due to VTE orders.. Rn does not feel comfortable entering order set do to VTE orders, especially since patient 's BMI is high. Midwife stated to enter in what was needed and she would enter the orders in the am.    Addendum note:  TC from nursing x 3   0220am inability to locate postpartum orders - surgeon enters post-op / postpartum orders - to check to see if released from PACU  0303am No postpartum orders found - verbal order given to enter routine postpartum order set.  0630am Inability to order postpartum order set as does not know how to do VTE orders -asked if provider wanted to start meds for VTE as patient has high BMI. Recommended to discontinue nurse entry and provider would enter orders. Asked what orders she needs now - states nothing needed now. Told ok to enter any routine nurse orders needed and provider will enter routine postpartum orders including VTE orders upon arrival to hospital this am. (surgery scheduled at 0730).  Marlinda Mikeanya Mitsy Owen CNM [id number]

## 2016-09-15 NOTE — Lactation Note (Signed)
This note was copied from a baby's chart. Lactation Consultation Note  Patient Name: [name] [id number]'QToday's Date: 09/15/2016  Initial assessment with first time mom of 23 hour old infant. Infant asleep in mom's arms. Mom reports she plans to breast and bottle feed. She is offering breast prior to bottle feeding. She reports she has no questions/concerns. She denies need for lactation assistance at this time.   Discussed supply and demand and milk coming to volume. Enc mom to feed infant STS 8-12 x in 24 hours at first feeding cues, offering breast prior to bottle feeding. Feeding log given with instructions for use.   BF Resources Handout and LC Brochure given, informed of IP/OP Services, BF Support Groups and LC phone #. Enc mom to call out to desk for feeding assistance as needed.    Maternal Data Formula Feeding for Exclusion: Yes Reason for exclusion: Mother's choice to formula and breast feed on admission Does the patient have breastfeeding experience prior to this delivery?: No  Feeding Nipple Type: Slow - flow  LATCH Score/Interventions                      Lactation Tools Discussed/Used WIC Program: No (Mom reports she has not applied)   Consult Status Consult Status: Follow-up Date: 09/16/16 Follow-up type: In-patient    Janice [name] 09/15/2016, 5:22 PM

## 2016-09-15 NOTE — Anesthesia Postprocedure Evaluation (Signed)
Anesthesia Post Note  Patient: Janice [name]  Procedure(s) Performed: Procedure(s) (LRB): Primary CESAREAN SECTION (N/A)  Patient location during evaluation: Mother Baby Anesthesia Type: Spinal Level of consciousness: awake Pain management: satisfactory to patient Vital Signs Assessment: post-procedure vital signs reviewed and stable Respiratory status: spontaneous breathing Cardiovascular status: stable Anesthetic complications: no     Last Vitals:  Vitals:   09/15/16 0300 09/15/16 0659  BP: 128/87 101/65  Pulse: 81 (!) 59  Resp: 20 18  Temp:  36.9 C    Last Pain:  Vitals:   09/15/16 0659  TempSrc: Oral  PainSc: 1    Pain Goal: Patients Stated Pain Goal: 0 (09/14/16 1828)               Cephus ShellingBURGER,Narda Fundora

## 2016-09-15 NOTE — Addendum Note (Signed)
Addendum  created 09/15/16 1008 by Algis GreenhouseLinda A Tremayne Sheldon, CRNA   Sign clinical note

## 2016-09-15 NOTE — Progress Notes (Addendum)
POSTOPERATIVE DAY # 1 S/P CS   S:         Reports feeling ok             Tolerating po intake / no nausea / no vomiting / + flatus / no BM             Bleeding is light             Pain controlled with motrin and percocet             Up ad lib / ambulatory/ voiding QS  Newborn breast and formula feeding   O:  VS: BP 101/65   Pulse (!) 59   Temp 98.4 F (36.9 C) (Oral)   Resp 18   Wt 118.8 kg (261 lb 14.5 oz)   LMP 12/14/2015   SpO2 100%   Breastfeeding? Unknown   BMI 44.96 kg/m    LABS:               Recent Labs  09/14/16 1339  WBC 8.0  HGB 11.4*  PLT 225               Bloodtype: --/--/O POS (12/08 1339)  Rubella: Immune (05/26 0000)                                            I&O: Intake/Output      12/08 0701 - 12/09 0700 12/09 0701 - 12/10 0700   P.O. 680    I.V. (mL/kg) 3300 (27.8)    Total Intake(mL/kg) 3980 (33.5)    Urine (mL/kg/hr) 825    Blood 800    Total Output 1625     Net +2355                     Physical Exam:             Alert and Oriented X3             IV infiltration - hand edematous (recommend warm heat)  Lungs: Clear and unlabored  Heart: regular rate and rhythm / no mumurs  Abdomen: soft, non-tender, non-distended, no edema or erythema in panus             Fundus: firm, non-tender, Ueven             Dressing intact honeycomb              Incision:  approximated with suture / no erythema / no ecchymosis / no drainage  Perineum: intact  Lochia: light  Extremities: 1+ pedal edema, no calf pain or tenderness, negative Homans  A:        POD # 1 S/P CS          P:        Routine postoperative care                 Needs to increase water intake orally                 Warm heat to IV infiltration site                  Marlinda MikeBAILEY, Markeshia Giebel CNM, MSN, Asa LenteFACNM 09/15/2016, [id number]

## 2016-09-16 NOTE — Progress Notes (Signed)
POSTOPERATIVE DAY # 2 S/P CS   S:         Reports feeling ok - more sore today             Tolerating po intake / no nausea / no vomiting / + flatus / no BM             Bleeding is light             Pain controlled with motrin and oxycodone             Up ad lib / ambulatory/ voiding QS  Newborn breast and formula feeding  / female  O:  VS: BP 136/84 (BP Location: Left Arm)   Pulse 73   Temp 98.1 F (36.7 C) (Oral)   Resp 18   Wt 118.8 kg (261 lb 14.5 oz)   LMP 12/14/2015   SpO2 100%   Breastfeeding? Unknown   BMI 44.96 kg/m    LABS:               Recent Labs  09/14/16 1339  WBC 8.0  HGB 11.4*  PLT 225               Bloodtype: --/--/O POS (12/08 1339)  Rubella: Immune (05/26 0000)                                             I&O: Intake/Output      12/09 0701 - 12/10 0700 12/10 0701 - 12/11 0700   P.O. 1680 120   I.V. (mL/kg)     Total Intake(mL/kg) 1680 (14.1) 120 (1)   Urine (mL/kg/hr) 2900 (1) 300 (0.6)   Blood     Total Output 2900 300   Net -1220 -180                   Physical Exam:             Alert and Oriented X3  Lungs: Clear and unlabored  Heart: regular rate and rhythm / no mumurs  Abdomen: soft, non-tender, non-distended, active BS             Panus soft without erythema or edema             Fundus: firm, non-tender, Ueven             Dressing intact              Incision:  approximated with suture / no erythema / no ecchymosis / no drainage  Perineum: intact  Lochia: light  Extremities: 1+ edema, no calf pain or tenderness  A:        POD # 2 S/P CS            Morbid obesity            Chronic hypertension - BP stable / improved outpt  P:        Routine postoperative care              Anticipate DC tomorrow - ambulate today     Marlinda MikeBAILEY, TANYA CNM, MSN, [facility] CenterFACNM 09/16/2016, 10:56 AM

## 2016-09-17 DIAGNOSIS — O10913 Unspecified pre-existing hypertension complicating pregnancy, third trimester: Secondary | ICD-10-CM | POA: Diagnosis present

## 2016-09-17 MED ORDER — OXYCODONE-ACETAMINOPHEN 5-325 MG PO TABS: 1.0000 | ORAL_TABLET | ORAL | 0 refills | Status: DC | PRN

## 2016-09-17 MED ORDER — IBUPROFEN 600 MG PO TABS: 600.0000 mg | ORAL_TABLET | Freq: Four times a day (QID) | ORAL | 0 refills | Status: DC

## 2016-09-17 NOTE — Op Note (Signed)
Janice Lan:  [name], Janice             ACCOUNT NO.:  [id number]  MEDICAL RECORD NO.:  [id number]  LOCATION:                                 FACILITY:  PHYSICIAN:  Janice [name], Janice HawkinsDATE OF BIRTH:  [birth year]  DATE OF PROCEDURE:  09/14/2016 DATE OF DISCHARGE:                              OPERATIVE REPORT   PREOPERATIVE DIAGNOSIS:  Chronic hypertension, abdominal cerclage, term gestation.  PROCEDURES:  Primary cesarean section, Kerr hysterotomy.  POSTOPERATIVE DIAGNOSIS:  Chronic hypertension, abdominal cerclage, term gestation.  ANESTHESIA:  Spinal.  ASSISTANT:  Denton Meekolita Dawson, CNM.  DESCRIPTION OF PROCEDURE:  Under adequate spinal anesthesia, the patient was placed in the supine position with a left lateral tilt.  She was sterilely prepped and draped in usual fashion.  An indwelling Foley catheter was sterilely placed.  0.25% Marcaine was injected along the planned Pfannenstiel skin incision site.  The incision was carried down to the rectus fascia.  Rectus fascia was opened transversely.  The rectus fascia was then bluntly and sharply dissected off the rectus muscle in a superior and inferior fashion.  The rectus muscles split in the midline.  The parietal peritoneum was entered bluntly and extended. An Alexis self-retaining retractor was then placed.  The bladder was then sharply dissected off the lower uterine segment and displaced inferiorly.  A curvilinear low transverse uterine incision was then made resulted in clear amniotic fluid.  The incision was extended and subsequent delivery of a live female from the left occiput anterior position was accomplished.  The baby was bulb suctioned in the abdomen. The cord was clamped.  The baby was transferred to the awaiting pediatricians, assigned Apgars of 9 and 9 at 1 and 5 minutes.  The placenta was manually removed, sent to Pathology.  Uterine cavity was cleaned of debris.  Uterine incision had no extension.  Uterine  incision was closed in 2 layers, the first layer with 0 Monocryl running lock stitch.  Second layer was imbricated using 0 Monocryl suture.  One bleeding area on the left side resulted a figure-of-eight suture being placed with good hemostasis achieved.  Normal tubes and ovaries were noted bilaterally.  Large amount of varicosity was noted in the left mesosalpinx area.  The abdomen was irrigated and suctioned of debris. Interceed was placed in inverted T fashion overlying the incision.  The parietal peritoneum was then closed with 2-0 Vicryl.  Rectus fascia was closed with 0 Vicryl x2.  The subcutaneous area was irrigated.  Small bleeders cauterized.  Interrupted 2-0 plain sutures placed and the skin approximated using 4-0 Vicryl subcuticular closure.  Steri-Strips and benzoin was then placed.  SPECIMENS:  Placenta sent to Pathology.  ESTIMATED BLOOD LOSS:  800 mL.  INTRAOPERATIVE FLUIDS:  2300 mL.  URINE OUTPUT:  100 mL clear yellow urine.  SPONGE AND INSTRUMENT COUNT:  Counts x2 were correct.  COMPLICATION:  None.  The patient tolerated the procedure well and was transferred to recovery room in stable condition.     Janice [name], M.D.     [place]/MEDQ  D:  09/14/2016  T:  09/15/2016  Job:  [id number]

## 2016-09-17 NOTE — Discharge Instructions (Signed)
Postpartum Depression and Baby Blues °The postpartum period begins right after the birth of a baby. During this time, there is often a great amount of joy and excitement. It is also a time of many changes in the life of the parents. Regardless of how many times a mother gives birth, each child brings new challenges and dynamics to the family. It is not unusual to have feelings of excitement along with confusing shifts in moods, emotions, and thoughts. All mothers are at risk of developing postpartum depression or the "baby blues." These mood changes can occur right after giving birth, or they may occur many months after giving birth. The baby blues or postpartum depression can be mild or severe. Additionally, postpartum depression can go away rather quickly, or it can be a long-term condition. °What are the causes? °Raised hormone levels and the rapid drop in those levels are thought to be a main cause of postpartum depression and the baby blues. A number of hormones change during and after pregnancy. Estrogen and progesterone usually decrease right after the delivery of your baby. The levels of thyroid hormone and various cortisol steroids also rapidly drop. Other factors that play a role in these mood changes include major life events and genetics. °What increases the risk? °If you have any of the following risks for the baby blues or postpartum depression, know what symptoms to watch out for during the postpartum period. Risk factors that may increase the likelihood of getting the baby blues or postpartum depression include: °· Having a personal or family history of depression. °· Having depression while being pregnant. °· Having premenstrual mood issues or mood issues related to oral contraceptives. °· Having a lot of life stress. °· Having marital conflict. °· Lacking a social support network. °· Having a baby with special needs. °· Having health problems, such as diabetes. °What are the signs or  symptoms? °Symptoms of baby blues include: °· Brief changes in mood, such as going from extreme happiness to sadness. °· Decreased concentration. °· Difficulty sleeping. °· Crying spells, tearfulness. °· Irritability. °· Anxiety. °Symptoms of postpartum depression typically begin within the first month after giving birth. These symptoms include: °· Difficulty sleeping or excessive sleepiness. °· Marked weight loss. °· Agitation. °· Feelings of worthlessness. °· Lack of interest in activity or food. °Postpartum psychosis is a very serious condition and can be dangerous. Fortunately, it is rare. Displaying any of the following symptoms is cause for immediate medical attention. Symptoms of postpartum psychosis include: °· Hallucinations and delusions. °· Bizarre or disorganized behavior. °· Confusion or disorientation. °How is this diagnosed? °A diagnosis is made by an evaluation of your symptoms. There are no medical or lab tests that lead to a diagnosis, but there are various questionnaires that a health care provider may use to identify those with the baby blues, postpartum depression, or psychosis. Often, a screening tool called the Edinburgh Postnatal Depression Scale is used to diagnose depression in the postpartum period. °How is this treated? °The baby blues usually goes away on its own in 1-2 weeks. Social support is often all that is needed. You will be encouraged to get adequate sleep and rest. Occasionally, you may be given medicines to help you sleep. °Postpartum depression requires treatment because it can last several months or longer if it is not treated. Treatment may include individual or group therapy, medicine, or both to address any social, physiological, and psychological factors that may play a role in the depression. Regular exercise, a   healthy diet, rest, and social support may also be strongly recommended. °Postpartum psychosis is more serious and needs treatment right away. Hospitalization is  often needed. °Follow these instructions at home: °· Get as much rest as you can. Nap when the baby sleeps. °· Exercise regularly. Some women find yoga and walking to be beneficial. °· Eat a balanced and nourishing diet. °· Do little things that you enjoy. Have a cup of tea, take a bubble bath, read your favorite magazine, or listen to your favorite music. °· Avoid alcohol. °· Ask for help with household chores, cooking, grocery shopping, or running errands as needed. Do not try to do everything. °· Talk to people close to you about how you are feeling. Get support from your partner, family members, friends, or other new moms. °· Try to stay positive in how you think. Think about the things you are grateful for. °· Do not spend a lot of time alone. °· Only take over-the-counter or prescription medicine as directed by your health care provider. °· Keep all your postpartum appointments. °· Let your health care provider know if you have any concerns. °Contact a health care provider if: °You are having a reaction to or problems with your medicine. °Get help right away if: °· You have suicidal feelings. °· You think you may harm the baby or someone else. °This information is not intended to replace advice given to you by your health care provider. Make sure you discuss any questions you have with your health care provider. °Document Released: 06/28/2004 Document Revised: 03/01/2016 Document Reviewed: 07/06/2013 °[employer]vier Interactive Patient Education © 2017 [employer] °Cesarean Delivery, Care After °Refer to this sheet in the next few weeks. These instructions provide you with information about caring for yourself after your procedure. Your health care provider may also give you more specific instructions. Your treatment has been planned according to current medical practices, but problems sometimes occur. Call your health care provider if you have any problems or questions after your procedure. °What can I expect  after the procedure? °After the procedure, it is common to have: °· A small amount of blood or clear fluid coming from the incision. °· Some redness, swelling, and pain in your incision area. °· Some abdominal pain and soreness. °· Vaginal bleeding (lochia). °· Pelvic cramps. °· Fatigue. °Follow these instructions at home: °Incision care °· Follow instructions from your health care provider about how to take care of your incision. Make sure you: °¨ Wash your hands with soap and water before you change your bandage (dressing). If soap and water are not available, use hand sanitizer. °¨ Change your dressing as told by your health care provider. °¨ Leave stitches (sutures), skin staples, skin glue, or adhesive strips in place. These skin closures may need to stay in place for 2 weeks or longer. If adhesive strip edges start to loosen and curl up, you may trim the loose edges. Do not remove adhesive strips completely unless your health care provider tells you to do that. °· Check your incision area every day for signs of infection. Check for: °¨ More redness, swelling, or pain. °¨ More fluid or blood. °¨ Warmth. °¨ Pus or a bad smell. °· When you cough or sneeze, hug a pillow. This helps with pain and decreases the chance of your incision opening up (dehiscing). Do this until your incision heals. °Medicines °· Take over-the-counter and prescription medicines only as told by your health care provider. °· If you were prescribed   an antibiotic medicine, take it as told by your health care provider. Do not stop taking the antibiotic until it is finished. °Driving °· Do not drive or operate heavy machinery while taking prescription pain medicine. °· Do not drive for 24 hours if you received a sedative. °Lifestyle °· Do not drink alcohol. This is especially important if you are breastfeeding or taking pain medicine. °· Do not use tobacco products, including cigarettes, chewing tobacco, or e-cigarettes. If you need help  quitting, ask your health care provider. Tobacco can delay wound healing. °Eating and drinking °· Drink at least 8 eight-ounce glasses of water every day unless told not to by your health care provider. If you breastfeed, you may need to drink more water than this. °· Eat high-fiber foods every day. These foods may help prevent or relieve constipation. High-fiber foods include: °¨ Whole grain cereals and breads. °¨ Brown rice. °¨ Beans. °¨ Fresh fruits and vegetables. °Activity °· Return to your normal activities as told by your health care provider. Ask your health care provider what activities are safe for you. °· Rest as much as possible. Try to rest or take a nap while your baby is sleeping. °· Do not lift anything that is heavier than your baby or 10 lb (4.5 kg) as told by your health care provider. °· Ask your health care provider when you can engage in sexual activity. This may depend on your: °¨ Risk of infection. °¨ Healing rate. °¨ Comfort and desire to engage in sexual activity. °Bathing °· Do not take baths, swim, or use a hot tub until your health care provider approves. Ask your health care provider if you can take showers. You may only be allowed to take sponge baths until your incision heals. °· Keep your dressing dry as told by your health care provider. °General instructions °· Do not use tampons or douches until your health care provider approves. °· Wear: °¨ Loose, comfortable clothing. °¨ A supportive and well-fitting bra. °· Watch for any blood clots that may pass from your vagina. These may look like clumps of dark red, brown, or black discharge. °· Keep your perineum clean and dry as told by your health care provider. °· Wipe from front to back when you use the toilet. °· If possible, have someone help you care for your baby and help with household activities for a few days after you leave the hospital. °· Keep all follow-up visits for you and your baby as told by your health care provider.  This is important. °Contact a health care provider if: °· You have: °¨ Bad-smelling vaginal discharge. °¨ Difficulty urinating. °¨ Pain when urinating. °¨ A sudden increase or decrease in the frequency of your bowel movements. °¨ More redness, swelling, or pain around your incision. °¨ More fluid or blood coming from your incision. °¨ Pus or a bad smell coming from your incision. °¨ A fever. °¨ A rash. °¨ Little or no interest in activities you used to enjoy. °¨ Questions about caring for yourself or your baby. °¨ Nausea. °· Your incision feels warm to the touch. °· Your breasts turn red or become painful or hard. °· You feel unusually sad or worried. °· You vomit. °· You pass large blood clots from your vagina. If you pass a blood clot, save it to show to your health care provider. Do not flush blood clots down the toilet without showing your health care provider. °· You urinate more than usual. °· You   are dizzy or light-headed. °· You have not breastfed and have not had a menstrual period for 12 weeks after delivery. °· You stopped breastfeeding and have not had a menstrual period for 12 weeks after stopping breastfeeding. °Get help right away if: °· You have: °¨ Pain that does not go away or get better with medicine. °¨ Chest pain. °¨ Difficulty breathing. °¨ Blurred vision or spots in your vision. °¨ Thoughts about hurting yourself or your baby. °¨ New pain in your abdomen or in one of your legs. °¨ A severe headache. °· You faint. °· You bleed from your vagina so much that you fill two sanitary pads in one hour. °This information is not intended to replace advice given to you by your health care provider. Make sure you discuss any questions you have with your health care provider. °Document Released: 06/16/2002 Document Revised: 02/02/2016 Document Reviewed: 08/29/2015 °[employer]vier Interactive Patient Education © 2017 [employer] °Mastitis °Mastitis is inflammation of the breast tissue. It occurs most often in  women who are breastfeeding, but it can also affect other women, and even sometimes men. °CAUSES  °Mastitis is usually caused by a bacterial infection. Bacteria enter the breast tissue through cuts or openings in the skin. Typically, this occurs with breastfeeding because of cracked or irritated skin. Sometimes, it can occur even when there is no opening in the skin. It can be associated with plugged milk (lactiferous) ducts. Nipple piercing can also lead to mastitis. Also, some forms of breast cancer can cause mastitis. °SIGNS AND SYMPTOMS  °· Swelling, redness, tenderness, and pain in an area of the breast. °· Swelling of the glands under the arm on the same side. °· Fever. °If an infection is allowed to progress, a collection of pus (abscess) may develop. °DIAGNOSIS  °Your health care provider can usually diagnose mastitis based on your symptoms and a physical exam. Tests may be done to help confirm the diagnosis. These may include:  °· Removal of pus from the breast by applying pressure to the area. This pus can be examined in the lab to determine which bacteria are present. If an abscess has developed, the fluid in the abscess can be removed with a needle. This can also be used to confirm the diagnosis and determine the bacteria present. In most cases, pus will not be present. °· Blood tests to determine if your body is fighting a bacterial infection. °· Mammogram or ultrasound tests to rule out other problems or diseases. °TREATMENT  °Antibiotic medicine is used to treat a bacterial infection. Your health care provider will determine which bacteria are most likely causing the infection and will select an appropriate antibiotic. This is sometimes changed based on the results of tests performed to identify the bacteria, or if there is no response to the antibiotic selected. Antibiotics are usually given by mouth. You may also be given medicine for pain. °Mastitis that occurs with breastfeeding will sometimes go  away on its own, so your health care provider may choose to wait 24 hours after first seeing you to decide whether a prescription medicine is needed. °HOME CARE INSTRUCTIONS  °· Only take over-the-counter or prescription medicines for pain, fever, or discomfort as directed by your health care provider. °· If your health care provider prescribed an antibiotic, take the medicine as directed. Make sure you finish it even if you start to feel better. °· Do not wear a tight or underwire bra. Wear a soft, supportive bra. °· Increase your fluid   intake, especially if you have a fever. °· Women who are breastfeeding should follow these instructions: °¨ Continue to empty the breast. Your health care provider can tell you whether this milk is safe for your infant or needs to be thrown out. You may be told to stop nursing until your health care provider thinks it is safe for your baby. Use a breast pump if you are advised to stop nursing. °¨ Keep your nipples clean and dry. °¨ Empty the first breast completely before going to the other breast. If your baby is not emptying your breasts completely for some reason, use a breast pump to empty your breasts. °¨ If you go back to work, pump your breasts while at work to stay in time with your nursing schedule. °¨ Avoid allowing your breasts to become overly filled with milk (engorged). °SEEK MEDICAL CARE IF:  °· You have pus-like discharge from the breast. °· Your symptoms do not improve with the treatment prescribed by your health care provider within 2 days. °SEEK IMMEDIATE MEDICAL CARE IF:  °· Your pain and swelling are getting worse. °· You have pain that is not controlled with medicine. °· You have a red line extending from the breast toward your armpit. °· You have a fever or persistent symptoms for more than 2-3 days. °· You have a fever and your symptoms suddenly get worse. °This information is not intended to replace advice given to you by your health care provider. Make sure  you discuss any questions you have with your health care provider. °Document Released: 09/24/2005 Document Revised: 09/29/2013 Document Reviewed: 04/24/2013 °[employer] Interactive Patient Education © 2017 [employer] °Breastfeeding °Deciding to breastfeed is one of the best choices you can make for you and your baby. A change in hormones during pregnancy causes your breast tissue to grow and increases the number and size of your milk ducts. These hormones also allow proteins, sugars, and fats from your blood supply to make breast milk in your milk-producing glands. Hormones prevent breast milk from being released before your baby is born as well as prompt milk flow after birth. Once breastfeeding has begun, thoughts of your baby, as well as his or her sucking or crying, can stimulate the release of milk from your milk-producing glands. °Benefits of breastfeeding °For Your Baby °· Your first milk (colostrum) helps your baby's digestive system function better. °· There are antibodies in your milk that help your baby fight off infections. °· Your baby has a lower incidence of asthma, allergies, and sudden infant death syndrome. °· The nutrients in breast milk are better for your baby than infant formulas and are designed uniquely for your baby’s needs. °· Breast milk improves your baby's brain development. °· Your baby is less likely to develop other conditions, such as childhood obesity, asthma, or type 2 diabetes mellitus. °For You °· Breastfeeding helps to create a very special bond between you and your baby. °· Breastfeeding is convenient. Breast milk is always available at the correct temperature and costs nothing. °· Breastfeeding helps to burn calories and helps you lose the weight gained during pregnancy. °· Breastfeeding makes your uterus contract to its prepregnancy size faster and slows bleeding (lochia) after you give birth. °· Breastfeeding helps to lower your risk of developing type 2 diabetes mellitus,  osteoporosis, and breast or ovarian cancer later in life. °Signs that your baby is hungry °Early Signs of Hunger °· Increased alertness or activity. °· Stretching. °· Movement of the head from   side to side. °· Movement of the head and opening of the mouth when the corner of the mouth or cheek is stroked (rooting). °· Increased sucking sounds, smacking lips, cooing, sighing, or squeaking. °· Hand-to-mouth movements. °· Increased sucking of fingers or hands. °Late Signs of Hunger °· Fussing. °· Intermittent crying. °Extreme Signs of Hunger  °Signs of extreme hunger will require calming and consoling before your baby will be able to breastfeed successfully. Do not wait for the following signs of extreme hunger to occur before you initiate breastfeeding: °· Restlessness. °· A loud, strong cry. °· Screaming. °Breastfeeding basics ° Breastfeeding Initiation °· Find a comfortable place to sit or lie down, with your neck and back well supported. °· Place a pillow or rolled up blanket under your baby to bring him or her to the level of your breast (if you are seated). Nursing pillows are specially designed to help support your arms and your baby while you breastfeed. °· Make sure that your baby's abdomen is facing your abdomen. °· Gently massage your breast. With your fingertips, massage from your chest wall toward your nipple in a circular motion. This encourages milk flow. You may need to continue this action during the feeding if your milk flows slowly. °· Support your breast with 4 fingers underneath and your thumb above your nipple. Make sure your fingers are well away from your nipple and your baby’s mouth. °· Stroke your baby's lips gently with your finger or nipple. °· When your baby's mouth is open wide enough, quickly bring your baby to your breast, placing your entire nipple and as much of the colored area around your nipple (areola) as possible into your baby's mouth. °¨ More areola should be visible above your  baby's upper lip than below the lower lip. °¨ Your baby's tongue should be between his or her lower gum and your breast. °· Ensure that your baby's mouth is correctly positioned around your nipple (latched). Your baby's lips should create a seal on your breast and be turned out (everted). °· It is common for your baby to suck about 2-3 minutes in order to start the flow of breast milk. °Latching  °Teaching your baby how to latch on to your breast properly is very important. An improper latch can cause nipple pain and decreased milk supply for you and poor weight gain in your baby. Also, if your baby is not latched onto your nipple properly, he or she may swallow some air during feeding. This can make your baby fussy. Burping your baby when you switch breasts during the feeding can help to get rid of the air. However, teaching your baby to latch on properly is still the best way to prevent fussiness from swallowing air while breastfeeding. °Signs that your baby has successfully latched on to your nipple: °· Silent tugging or silent sucking, without causing you pain. °· Swallowing heard between every 3-4 sucks. °· Muscle movement above and in front of his or her ears while sucking. °Signs that your baby has not successfully latched on to nipple: °· Sucking sounds or smacking sounds from your baby while breastfeeding. °· Nipple pain. °If you think your baby has not latched on correctly, slip your finger into the corner of your baby’s mouth to break the suction and place it between your baby's gums. Attempt breastfeeding initiation again. °Signs of Successful Breastfeeding  °Signs from your baby: °· A gradual decrease in the number of sucks or complete cessation of sucking. °· Falling   asleep. °· Relaxation of his or her body. °· Retention of a small amount of milk in his or her mouth. °· Letting go of your breast by himself or herself. °Signs from you: °· Breasts that have increased in firmness, weight, and size 1-3  hours after feeding. °· Breasts that are softer immediately after breastfeeding. °· Increased milk volume, as well as a change in milk consistency and color by the fifth day of breastfeeding. °· Nipples that are not sore, cracked, or bleeding. °Signs That Your Baby is Getting Enough Milk °· Wetting at least 1-2 diapers during the first 24 hours after birth. °· Wetting at least 5-6 diapers every 24 hours for the first week after birth. The urine should be clear or pale yellow by 5 days after birth. °· Wetting 6-8 diapers every 24 hours as your baby continues to grow and develop. °· At least 3 stools in a 24-hour period by age 5 days. The stool should be soft and yellow. °· At least 3 stools in a 24-hour period by age 7 days. The stool should be seedy and yellow. °· No loss of weight greater than 10% of birth weight during the first 3 days of age. °· Average weight gain of 4-7 ounces (113-198 g) per week after age 4 days. °· Consistent daily weight gain by age 5 days, without weight loss after the age of 2 weeks. °After a feeding, your baby may spit up a small amount. This is common. °Breastfeeding frequency and duration °Frequent feeding will help you make more milk and can prevent sore nipples and breast engorgement. Breastfeed when you feel the need to reduce the fullness of your breasts or when your baby shows signs of hunger. This is called "breastfeeding on demand." Avoid introducing a pacifier to your baby while you are working to establish breastfeeding (the first 4-6 weeks after your baby is born). After this time you may choose to use a pacifier. Research has shown that pacifier use during the first year of a baby's life decreases the risk of sudden infant death syndrome (SIDS). °Allow your baby to feed on each breast as long as he or she wants. Breastfeed until your baby is finished feeding. When your baby unlatches or falls asleep while feeding from the first breast, offer the second breast. Because  newborns are often sleepy in the first few weeks of life, you may need to awaken your baby to get him or her to feed. °Breastfeeding times will vary from baby to baby. However, the following rules can serve as a guide to help you ensure that your baby is properly fed: °· Newborns (babies 4 weeks of age or younger) may breastfeed every 1-3 hours. °· Newborns should not go longer than 3 hours during the day or 5 hours during the night without breastfeeding. °· You should breastfeed your baby a minimum of 8 times in a 24-hour period until you begin to introduce solid foods to your baby at around 6 months of age. °Breast milk pumping °Pumping and storing breast milk allows you to ensure that your baby is exclusively fed your breast milk, even at times when you are unable to breastfeed. This is especially important if you are going back to work while you are still breastfeeding or when you are not able to be present during feedings. Your lactation consultant can give you guidelines on how long it is safe to store breast milk. °A breast pump is a machine that allows you to   pump milk from your breast into a sterile bottle. The pumped breast milk can then be stored in a refrigerator or freezer. Some breast pumps are operated by hand, while others use electricity. Ask your lactation consultant which type will work best for you. Breast pumps can be purchased, but some hospitals and breastfeeding support groups lease breast pumps on a monthly basis. A lactation consultant can teach you how to hand express breast milk, if you prefer not to use a pump. °Caring for your breasts while you breastfeed °Nipples can become dry, cracked, and sore while breastfeeding. The following recommendations can help keep your breasts moisturized and healthy: °· Avoid using soap on your nipples. °· Wear a supportive bra. Although not required, special nursing bras and tank tops are designed to allow access to your breasts for breastfeeding without  taking off your entire bra or top. Avoid wearing underwire-style bras or extremely tight bras. °· Air dry your nipples for 3-4 minutes after each feeding. °· Use only cotton bra pads to absorb leaked breast milk. Leaking of breast milk between feedings is normal. °· Use lanolin on your nipples after breastfeeding. Lanolin helps to maintain your skin's normal moisture barrier. If you use pure lanolin, you do not need to wash it off before feeding your baby again. Pure lanolin is not toxic to your baby. You may also hand express a few drops of breast milk and gently massage that milk into your nipples and allow the milk to air dry. °In the first few weeks after giving birth, some women experience extremely full breasts (engorgement). Engorgement can make your breasts feel heavy, warm, and tender to the touch. Engorgement peaks within 3-5 days after you give birth. The following recommendations can help ease engorgement: °· Completely empty your breasts while breastfeeding or pumping. You may want to start by applying warm, moist heat (in the shower or with warm water-soaked hand towels) just before feeding or pumping. This increases circulation and helps the milk flow. If your baby does not completely empty your breasts while breastfeeding, pump any extra milk after he or she is finished. °· Wear a snug bra (nursing or regular) or tank top for 1-2 days to signal your body to slightly decrease milk production. °· Apply ice packs to your breasts, unless this is too uncomfortable for you. °· Make sure that your baby is latched on and positioned properly while breastfeeding. °If engorgement persists after 48 hours of following these recommendations, contact your health care provider or a lactation consultant. °Overall health care recommendations while breastfeeding °· Eat healthy foods. Alternate between meals and snacks, eating 3 of each per day. Because what you eat affects your breast milk, some of the foods may make  your baby more irritable than usual. Avoid eating these foods if you are sure that they are negatively affecting your baby. °· Drink milk, fruit juice, and water to satisfy your thirst (about 10 glasses a day). °· Rest often, relax, and continue to take your prenatal vitamins to prevent fatigue, stress, and anemia. °· Continue breast self-awareness checks. °· Avoid chewing and smoking tobacco. Chemicals from cigarettes that pass into breast milk and exposure to secondhand smoke may harm your baby. °· Avoid alcohol and drug use, including marijuana. °Some medicines that may be harmful to your baby can pass through breast milk. It is important to ask your health care provider before taking any medicine, including all over-the-counter and prescription medicine as well as vitamin and herbal supplements. °It is   possible to become pregnant while breastfeeding. If birth control is desired, ask your health care provider about options that will be safe for your baby. °Contact a health care provider if: °· You feel like you want to stop breastfeeding or have become frustrated with breastfeeding. °· You have painful breasts or nipples. °· Your nipples are cracked or bleeding. °· Your breasts are red, tender, or warm. °· You have a swollen area on either breast. °· You have a fever or chills. °· You have nausea or vomiting. °· You have drainage other than breast milk from your nipples. °· Your breasts do not become full before feedings by the fifth day after you give birth. °· You feel sad and depressed. °· Your baby is too sleepy to eat well. °· Your baby is having trouble sleeping. °· Your baby is wetting less than 3 diapers in a 24-hour period. °· Your baby has less than 3 stools in a 24-hour period. °· Your baby's skin or the white part of his or her eyes becomes yellow. °· Your baby is not gaining weight by 5 days of age. °Get help right away if: °· Your baby is overly tired (lethargic) and does not want to wake up and  feed. °· Your baby develops an unexplained fever. °This information is not intended to replace advice given to you by your health care provider. Make sure you discuss any questions you have with your health care provider. °Document Released: 09/24/2005 Document Revised: 03/07/2016 Document Reviewed: 03/18/2013 °Elsevier Interactive Patient Education © 2017 [employer] °Breast Pumping Tips °If you are breastfeeding, there may be times when you cannot feed your baby directly. Returning to work or going on a trip are common examples. Pumping allows you to store breast milk and feed it to your baby later. °You may not get much milk when you first start to pump. Your breasts should start to make more after a few days. If you pump at the times you usually feed your baby, you may be able to keep making enough milk to feed your baby without also using formula. The more often you pump, the more milk you will produce. °When should I pump? °· You can begin to pump soon after delivery. However, some experts recommend waiting about 4 weeks before giving your infant a bottle to make sure breastfeeding is going well. °· If you plan to return to work, begin pumping a few weeks before. This will help you develop techniques that work best for you. It also lets you build up a supply of breast milk. °· When you are with your infant, feed on demand and pump after each feeding. °· When you are away from your infant for several hours, pump for about 15 minutes every 2-3 hours. Pump both breasts at the same time if you can. °· If your infant has a formula feeding, make sure to pump around the same time. °· If you drink any alcohol, wait 2 hours before pumping. °How do I prepare to pump? °Your let-down reflex is the natural reaction to stimulation that makes your breast milk flow. It is easier to stimulate this reflex when you are relaxed. Find relaxation techniques that work for you. If you have difficulty with your let-down reflex, try  these methods: °· Smell one of your infant's blankets or an item of clothing. °· Look at a picture or video of your infant. °· Sit in a quiet, private space. °· Massage the breast you plan to pump. °·   Place soothing warmth on the breast. °· Play relaxing music. °What are some general breast pumping tips? °· Wash your hands before you pump. You do not need to wash your nipples or breasts. °· There are three ways to pump. °¨ You can use your hand to massage and compress your breast. °¨ You can use a handheld manual pump. °¨ You can use an electric pump. °· Make sure the suction cup (flange) on the breast pump is the right size. Place the flange directly over the nipple. If it is the wrong size or placed the wrong way, it may be painful and cause nipple damage. °· If pumping is uncomfortable, apply a small amount of purified or modified lanolin to your nipple and areola. °· If you are using an electric pump, adjust the speed and suction power to be more comfortable. °· If pumping is painful or if you are not getting very much milk, you may need a different type of pump. A lactation consultant can help you determine what type of pump to use. °· Keep a full water bottle near you at all times. Drinking lots of fluid helps you make more milk. °· You can store your milk to use later. Pumped breast milk can be stored in a sealable, sterile container or plastic bag. Label all stored breast milk with the date you pumped it. °¨ Milk can stay out at room temperature for up to 8 hours. °¨ You can store your milk in the refrigerator for up to 8 days. °¨ You can store your milk in the freezer for 3 months. Thaw frozen milk using warm water. Do not put it in the microwave. °· Do not smoke. Smoking can lower your milk supply and harm your infant. If you need help quitting, ask your health care provider to recommend a program. °When should I call my health care provider or a lactation consultant? °· You are having trouble  pumping. °· You are concerned that you are not making enough milk. °· You have nipple pain, soreness, or redness. °· You want to use birth control. Birth control pills may lower your milk supply. Talk to your health care provider about your options. °This information is not intended to replace advice given to you by your health care provider. Make sure you discuss any questions you have with your health care provider. °Document Released: 03/14/2010 Document Revised: 03/07/2016 Document Reviewed: 07/17/2013 °Elsevier Interactive Patient Education © 2017 Elsevier Inc. ° °

## 2016-09-17 NOTE — Discharge Summary (Signed)
OB Discharge Summary     Patient Name: Janice GallusKendra R Isidoro DOB: 02-25-1981 MRN: [id number]  Date of admission: 09/14/2016 Delivering MD: Serin Thornell   Date of discharge: 09/17/2016  Admitting diagnosis: Abdominal Cerclage, Chronic Hypertension Intrauterine pregnancy: [id number]     Secondary diagnosis:  Principal Problem:   Postpartum care following cesarean delivery (12/8) Active Problems:   HTN in pregnancy, chronic   Chronic hypertension in obstetric context, third trimester  Additional problems: none     Discharge diagnosis: Term Pregnancy Delivered and CHTN                                                                                                Post partum procedures:none  Augmentation: n/a  Complications: None  Hospital course:  Sceduled C/S   35 y.o. yo G2P1101 at 3758w0d was admitted to the hospital 09/14/2016 for scheduled cesarean section with the following indication: abdominal cerclage, chronic HTN  In preg Membrane Rupture Time/Date: 5:29 PM ,09/14/2016   Patient delivered a Viable  Female infant.09/14/2016  Details of operation can be found in separate operative note.  Pateint had an uncomplicated postpartum course.  She is ambulating, tolerating a regular diet, passing flatus, and urinating well. Patient is discharged home in stable condition on  09/17/16          Physical exam  Vitals:   09/15/16 1736 09/16/16 0602 09/16/16 1835 09/17/16 0552  BP: 128/78 136/84 (!) 144/84 135/79  Pulse: 80 73 78 78  Resp: 20 18 18 18   Temp: 97.2 F (36.2 C) 98.1 F (36.7 C) 98 F (36.7 C) 98.4 F (36.9 C)  TempSrc: Axillary Oral Oral Oral  SpO2: 100%     Weight:       General: alert, cooperative and no distress Lochia: appropriate Uterine Fundus: firm, midline, U-2 Incision: Healing well with no significant drainage, No significant erythema, skin well-approximated with sutures DVT Evaluation: No evidence of DVT seen on physical exam. Negative Homan's  sign. No cords or calf tenderness. No significant calf/ankle edema. Labs: Lab Results  Component Value Date   WBC 8.0 09/14/2016   HGB 11.4 (L) 09/14/2016   HCT 35.0 (L) 09/14/2016   MCV 88.6 09/14/2016   PLT 225 09/14/2016   CMP Latest Ref Rng & Units 09/14/2016  Glucose 65 - 99 mg/dL 73  BUN 6 - 20 mg/dL 18  Creatinine 4.090.44 - 8.111.00 mg/dL 9.140.84  Sodium 782135 - 956145 mmol/L 137  Potassium 3.5 - 5.1 mmol/L 4.0  Chloride 101 - 111 mmol/L 111  CO2 22 - 32 mmol/L 20(L)  Calcium 8.9 - 10.3 mg/dL 8.9  Total Protein 6.5 - 8.1 g/dL -  Total Bilirubin 0.3 - 1.2 mg/dL -  Alkaline Phos 38 - 213126 U/L -  AST 15 - 41 U/L -  ALT 14 - 54 U/L -    Discharge instruction: per After Visit Summary and "Baby and Me Booklet".  After visit meds:    Medication List    STOP taking these medications   hydroxyprogesterone caproate 250 mg/mL Oil injection Commonly known as:  MAKENA  TAKE these medications   docusate sodium 100 MG capsule Commonly known as:  COLACE Take 100 mg by mouth daily.   ferrous sulfate 325 (65 FE) MG tablet Take 325 mg by mouth daily with breakfast.   ibuprofen 600 MG tablet Commonly known as:  ADVIL,MOTRIN Take 1 tablet (600 mg total) by mouth every 6 (six) hours.   labetalol 300 MG tablet Commonly known as:  NORMODYNE Take 300 mg by mouth 3 (three) times daily.   oxyCODONE-acetaminophen 5-325 MG tablet Commonly known as:  PERCOCET/ROXICET Take 1 tablet by mouth every 4 (four) hours as needed for moderate pain.   triamcinolone cream 0.1 % Commonly known as:  KENALOG Apply 1 application topically 2 (two) times daily as needed (for eczema).   VITAFOL ULTRA 29-0.6-0.4-200 MG Caps Take 1 capsule by mouth daily.       Diet: routine diet  Activity: Advance as tolerated. Pelvic rest for 6 weeks.   Outpatient follow up: 1 week for BP check & 6 weeks Follow up Appt: Future Appointments Date Time Provider Department Center  09/21/2016 1:00 PM [facility]  CONSULTANT [facility] None   Follow up Visit:No Follow-up on file.  Postpartum contraception: Undecided  Newborn Data: Live born female on 09/14/2016 Birth Weight: 6 lb 4.5 oz (2850 g) APGAR: 9, 9  Baby Feeding: Bottle Disposition:home with mother   09/17/2016 Janice [name], Janice [name], Janice [name], CNM

## 2016-09-17 NOTE — Lactation Note (Signed)
This note was copied from a baby's chart. Lactation Consultation Note  Patient Name: Girl Berton LanKendra Kazee ZOXWR'UToday's Date: 09/17/2016   Visited with Mom on day of discharge, baby 4164 hrs old.  Mom has been bottle feeding baby about 30 ml for last 2 days.  She states she still would like to BF but doesn't know if she will be able to.  History of hypertension (Methyldopa Rx), PCOS, and infertility.  Mom has very large, heavy, pendulous breasts with flat nipples.  Areola difficult to compress.  Mom has not pumped yet.  Offered to assist her with breast massage, hand expression, and use of DEBP.  Baby swaddled in crib, but Mom declined assist with latching baby to the breast.  Assisted Mom with pumping using the initiation cycle, and after pumping, demonstrated breast massage and hand expression.  Colostrum drop noted.  Mom seemed relieved and pleased.  Encouraged her to have baby STS as much as she can, and try to latch,  Pumping routine discussed (has a Medela PIS at home).  Offered an OP lactation appointment to assist with her latching baby to the breast, and Mom was very interested.  Instructed Mom of cleaning procedure of pump parts.  OP appointment made for Friday, 10/23/15 @ 1pm. Mom to call prn for any assistance.       Judee ClaraSmith, Frederico Gerling E 09/17/2016, 10:24 AM

## 2016-09-17 NOTE — Progress Notes (Signed)
Patient ID: Janice GallusKendra R [name], female   DOB: 01-17-1981, 35 y.o.   MRN: [id number] Subjective: Primary Cesarean Delivery d/t Abdominal Cerclage, Chronic Hypertension POD# 3 Information for the patient's newborn:  Janice [name], Janice [name] [[id number]]  female   Reports feeling well. Feeding: bottle Patient reports tolerating PO.  Breast symptoms: nipples are very flat Pain controlled with ibuprofen (OTC) and narcotic analgesics including Percocet Denies HA/SOB/C/P/N/V/dizziness. Flatus present. No BM. She reports vaginal bleeding as normal, without clots.  She is ambulating, urinating without difficult.     Objective:   VS:  Vitals:   09/15/16 1736 09/16/16 0602 09/16/16 1835 09/17/16 0552  BP: 128/78 136/84 (!) 144/84 135/79  Pulse: 80 73 78 78  Resp: 20 18 18 18   Temp: 97.2 F (36.2 C) 98.1 F (36.7 C) 98 F (36.7 C) 98.4 F (36.9 C)  TempSrc: Axillary Oral Oral Oral  SpO2: 100%     Weight:         Intake/Output Summary (Last 24 hours) at 09/17/16 0901 Last data filed at 09/17/16 0700  Gross per 24 hour  Intake             1800 ml  Output             1550 ml  Net              250 ml        Recent Labs  09/14/16 1339  WBC 8.0  HGB 11.4*  HCT 35.0*  PLT 225      Blood type: --/--/O POS (12/08 1339)  Rubella: Immune (05/26 0000)   TdaP received 07/2016   Physical Exam:   General: alert, cooperative and no distress  Abdomen: soft, nontender, normal bowel sounds  Incision: clean, dry, intact and Honeycomb dressing loosely attached  Uterine Fundus: firm, 2 FB below umbilicus, tender  Lochia: minimal  Ext: extremities normal, atraumatic, no cyanosis or edema, Homans sign is negative, no sign of DVT and no edema, redness or tenderness in the calves or thighs   Assessment/Plan: 35 y.o.   POD# 3.  S/P Cesarean Delivery.  Indications: Abdominal Cerclage, Chronic Hypertension                Principal Problem:   Postpartum care following cesarean delivery  (12/8) Active Problems:   HTN in pregnancy, chronic  Doing well, stable.               Regular diet as tolerated Ambulate Routine post-op care D/C Home today  Raelyn MoraAWSON, Jeannett Dekoning, M, MSN, CNM 09/17/2016, 9:01 AM

## 2016-09-18 LAB — TYPE AND SCREEN
BLOOD PRODUCT EXPIRATION DATE: 201712272359
Blood Product Expiration Date: 201712272359
ISSUE DATE / TIME: 201712062055
ISSUE DATE / TIME: 201712062055
Unit Type and Rh: 5100
Unit Type and Rh: 5100

## 2016-09-21 ENCOUNTER — Ambulatory Visit (HOSPITAL_COMMUNITY)
Admit: 2016-09-21 | Discharge: 2016-09-21 | Disposition: A | Discharge status: Home / Self Care | Payer: BLUE CROSS/BLUE SHIELD | Source: Ambulatory Visit | Attending: Obstetrics and Gynecology | Admitting: Obstetrics and Gynecology

## 2016-09-21 NOTE — Lactation Note (Signed)
Lactation Consult for Janice FishKendra [name] (mother) and Janice [name] (DOB: [birth year])   Mother's reason for visit: to get infant to latch Consult:  Initial Lactation Consultant:  Remigio Eisenmengerichey, Andreyah Natividad Hamilton  ________________________________________________________________________ BW: 2850g (6# 4.5oz) D/c wt: 5# 14.4oz (down 6.1%) Nadir: 5# 12oz (09-19-16) Peds office: 6# 2 oz  Wt during consult: 6# 2.8oz (2802g) _______________________________________________________________________  Mother's Name: Janice [name] Type of delivery:  C/S Breastfeeding Experience:  primip   Maternal Medical Conditions:  Polycystic ovarian syndrome, Pregnancy induced hypertension and Infertility Maternal Medications: IB, Oxycodone, PNV,  ________________________________________________________________________  Supplementation Brand: Similac  Method:  Bottle  1.5 - 1.75oz of formula/EBM q3-3.5 hours  Pumping At least bid; 15 min 2 oz/session Medela Last pumped 1900  Infant Intake and Output Assessment  Voids:  4 in 24 hrs.  Color:  Clear yellow Stools:  9 in 24 hrs.  Color:  Samule DryGreen, Brown and Yellow  ________________________________________________________________________  Maternal Breast Assessment  Breast:  Full Nipple:  Erect  _______________________________________________________________________ Feeding Assessment/Evaluation  Initial feeding assessment:  Infant's oral assessment:  WNL   Attached assessment:  Deep  Lips flanged:  Yes.    Suck assessment:  Displays both  Tools:  Nipple shield 20 mm Instructed on use and cleaning of tool:  Yes.    Pre-feed weight: 2802 g   Post-feed weight: 2826 g Amount transferred: 24 ml R breast, with nipple shield 30+ minutes  Pre-feed weight: 2800 g (diaper changed) Post-feed weight: 2814 g Amount transferred: 14 ml 10 minutes  Total amount transferred: 38 ml* *Janice would have likely taken more, but parents decided to use a  pacifier & wait until they get home (despite me offering to provide a bottle of formula). Despite education, parents seem to resist that certain infant behaviors are feeding cues.   Janice [name] is 101 week old & 1.7% below BW. She has been exclusively bottle-fed. Mom pumps twice/day. Mom's breasts were full during consult. Mom had last pumped 18 hours ago (Mom was made aware that if she consistently delays pumping for that long, she will no longer have a sufficient supply).   Janice [name] was put to the bare breast, but did not latch successfully despite trying to use the teacup hold. A nipple shield was provided (first a size 16, then 20) and infant did latch. Mom was comfortable with the size 16, but infant did better with the size 20 (more pliable). Mom was comfortable w/latch. Janice [name] would occasionally come off, but was easy to get to relatch. Specifics of an asymmetric latch shown via [employer] website animation.   Mom with some concern about [name]'s ability to breathe since mother is large-breasted. Mom was reassured in a variety of ways and pictures were taken with Dad's cell phone to show the area around baby's nose & her ability to breathe.   Mom states that she will put [facility] to the breast & offer bottles of EBM.  Mom had been using size 27 flanges when pumping but those are too large for her. Size 21 flanges provided.   Next peds appt 11-03-15. To ensure that breast feedings are going well, a f/u LC appt was made 1 week from today.  An extra size 20 nipple shield was provided.   [name], RN, IBCLC

## 2016-09-28 ENCOUNTER — Ambulatory Visit (HOSPITAL_COMMUNITY): Payer: BLUE CROSS/BLUE SHIELD

## 2019-08-07 ENCOUNTER — Other Ambulatory Visit: Payer: Self-pay

## 2019-08-07 DIAGNOSIS — Z20822 Contact with and (suspected) exposure to covid-19: Secondary | ICD-10-CM

## 2019-08-08 LAB — NOVEL CORONAVIRUS, NAA: SARS-CoV-2, NAA: NOT DETECTED

## 2023-05-17 ENCOUNTER — Encounter (HOSPITAL_COMMUNITY): Payer: Self-pay | Admitting: Obstetrics and Gynecology

## 2023-05-17 ENCOUNTER — Inpatient Hospital Stay (HOSPITAL_COMMUNITY)
Admission: AD | Admit: 2023-05-17 | Discharge: 2023-05-17 | Disposition: A | Discharge status: Home / Self Care | Payer: Medicaid Other | Attending: Obstetrics and Gynecology | Admitting: Obstetrics and Gynecology

## 2023-05-17 ENCOUNTER — Inpatient Hospital Stay (HOSPITAL_COMMUNITY): Payer: Medicaid Other

## 2023-05-17 DIAGNOSIS — Z6740 Type O blood, Rh positive: Secondary | ICD-10-CM

## 2023-05-17 DIAGNOSIS — O034 Incomplete spontaneous abortion without complication: Secondary | ICD-10-CM | POA: Diagnosis not present

## 2023-05-17 DIAGNOSIS — N939 Abnormal uterine and vaginal bleeding, unspecified: Secondary | ICD-10-CM

## 2023-05-17 DIAGNOSIS — Z3A09 9 weeks gestation of pregnancy: Secondary | ICD-10-CM

## 2023-05-17 DIAGNOSIS — O209 Hemorrhage in early pregnancy, unspecified: Secondary | ICD-10-CM | POA: Diagnosis present

## 2023-05-17 LAB — WET PREP, GENITAL
Clue Cells Wet Prep HPF POC: NONE SEEN
Sperm: NONE SEEN
Trich, Wet Prep: NONE SEEN
WBC, Wet Prep HPF POC: <10 (ref <10)
Yeast Wet Prep HPF POC: NONE SEEN

## 2023-05-17 LAB — CBC
HCT: 38.4 % (ref 36.0–46.0)
Hemoglobin: 12.2 g/dL (ref 12.0–15.0)
MCH: 29 pg (ref 26.0–34.0)
MCHC: 31.8 g/dL (ref 30.0–36.0)
MCV: 91.4 fL (ref 80.0–100.0)
Platelets: 160 10*3/uL (ref 150–400)
RBC: 4.2 MIL/uL (ref 3.87–5.11)
RDW: 13.9 % (ref 11.5–15.5)
WBC: 5.5 10*3/uL (ref 4.0–10.5)
nRBC: 0 % (ref 0.0–0.2)

## 2023-05-17 LAB — HIV ANTIBODY (ROUTINE TESTING W REFLEX): HIV Screen 4th Generation wRfx: NONREACTIVE

## 2023-05-17 LAB — POCT PREGNANCY, URINE: Preg Test, Ur: POSITIVE — AB

## 2023-05-17 LAB — HCG, QUANTITATIVE, PREGNANCY: hCG, Beta Chain, Quant, S: 5711 m[IU]/mL — ABNORMAL HIGH (ref <5)

## 2023-05-17 MED ORDER — OXYCODONE-ACETAMINOPHEN 5-325 MG PO TABS: 1.0000 | ORAL_TABLET | ORAL | 0 refills | Status: AC | PRN

## 2023-05-17 MED ORDER — IBUPROFEN 600 MG PO TABS: 600.0000 mg | ORAL_TABLET | Freq: Four times a day (QID) | ORAL | 0 refills | Status: AC

## 2023-05-17 NOTE — MAU Provider Note (Signed)
History     CSN: [id number]  Arrival date and time: 05/17/23 1158   None     Chief Complaint  Patient presents with   Vaginal Bleeding   HPI  [name] is a 42 y.o. female G3P1101 @ [redacted]w[redacted]d here in MAU with complaints of vaginal bleeding. She has been followed in the OBGYN office for quants. This pregnancy was conceived spontaneously. Her previous pregnancy was an IVF pregnancy.    Quants in the were:  7/30: 2705  8/8: 6177 8/9: 5711   Next appointment in the office is scheduled for 8/13.   OB History     Gravida  3   Para  2   Term  1   Preterm  1   AB      Living  1      SAB      IAB      Ectopic      Multiple  0   Live Births  1           Past Medical History:  Diagnosis Date   Anemia    Hypertension    takes methyldopa   Miscarriage within last 12 months    22 weeks   PCOS (polycystic ovarian syndrome)     Past Surgical History:  Procedure Laterality Date   CERCLAGE LAPAROSCOPIC ABDOMINAL N/A 03/14/2016   Procedure: CERCLAGE LAPAROSCOPIC ABDOMINAL;  Surgeon: Fermin Schwab, MD;  Location: [facility];  Service: Gynecology;  Laterality: N/A;   CERVICAL CERCLAGE N/A 04/16/2015   Procedure: CERCLAGE CERVICAL;  Surgeon: Noland Fordyce, MD;  Location: [facility];  Service: Gynecology;  Laterality: N/A;   CESAREAN SECTION N/A 09/14/2016   Procedure: Primary CESAREAN SECTION;  Surgeon: Maxie Better, MD;  Location: [facility];  Service: Obstetrics;  Laterality: N/A;   LAPAROSCOPIC CHOLECYSTECTOMY  2003   POLYPECTOMY  2016    Family History  Problem Relation Age of Onset   Hypertension Mother    Cancer Mother    Cancer Maternal Aunt    Hypertension Maternal Grandmother    Cancer Maternal Grandmother     Social History   Tobacco Use   Smoking status: Never   Smokeless tobacco: Never  Substance Use Topics   Alcohol use: Yes    Comment: none since pregnancy   Drug use: No    Allergies: No Known Allergies  Medications  Prior to Admission  Medication Sig Dispense Refill Last Dose   labetalol (NORMODYNE) 300 MG tablet Take 300 mg by mouth 3 (three) times daily.   05/17/2023   Prenat-Fe Poly-Methfol-FA-DHA (VITAFOL ULTRA) 29-0.6-0.4-200 MG CAPS Take 1 capsule by mouth daily.   05/17/2023   docusate sodium (COLACE) 100 MG capsule Take 100 mg by mouth daily.      ferrous sulfate 325 (65 FE) MG tablet Take 325 mg by mouth daily with breakfast.      ibuprofen (ADVIL,MOTRIN) 600 MG tablet Take 1 tablet (600 mg total) by mouth every 6 (six) hours. 30 tablet 0    oxyCODONE-acetaminophen (PERCOCET/ROXICET) 5-325 MG tablet Take 1 tablet by mouth every 4 (four) hours as needed for moderate pain. 30 tablet 0    triamcinolone cream (KENALOG) 0.1 % Apply 1 application topically 2 (two) times daily as needed (for eczema).       Results for orders placed or performed during the hospital encounter of 05/17/23 (from the past 48 hour(s))  Pregnancy, urine POC     Status: Abnormal   Collection Time: 05/17/23 12:15  PM  Result Value Ref Range   Preg Test, Ur POSITIVE (A) NEGATIVE    Comment:        THE SENSITIVITY OF THIS METHODOLOGY IS >24 mIU/mL   CBC     Status: None   Collection Time: 05/17/23  2:12 PM  Result Value Ref Range   WBC 5.5 4.0 - 10.5 K/uL   RBC 4.20 3.87 - 5.11 MIL/uL   Hemoglobin 12.2 12.0 - 15.0 g/dL   HCT 82.9 56.2 - 13.0 %   MCV 91.4 80.0 - 100.0 fL   MCH 29.0 26.0 - 34.0 pg   MCHC 31.8 30.0 - 36.0 g/dL   RDW 86.5 78.4 - 69.6 %   Platelets 160 150 - 400 K/uL   nRBC 0.0 0.0 - 0.2 %    Comment: Performed at [facility], [address] [place], [place] [address]  hCG, quantitative, pregnancy     Status: Abnormal   Collection Time: 05/17/23  2:12 PM  Result Value Ref Range   hCG, Beta Chain, Quant, S 5,711 (H) <5 mIU/mL    Comment:          GEST. AGE      CONC.  (mIU/mL)   <=1 WEEK        5 - 50     2 WEEKS       50 - 500     3 WEEKS       100 - 10,000     4 WEEKS     1,000 - 30,000     5  WEEKS     3,500 - 115,000   6-8 WEEKS     12,000 - 270,000    12 WEEKS     15,000 - 220,000        FEMALE AND NON-PREGNANT FEMALE:     LESS THAN 5 mIU/mL Performed at [facility], [address], [place], [place] [address]   HIV Antibody (routine testing w rflx)     Status: None   Collection Time: 05/17/23  2:12 PM  Result Value Ref Range   HIV Screen 4th Generation wRfx Non Reactive Non Reactive    Comment: Performed at [facility], [address], [place], [place] [address]  Wet prep, genital     Status: None   Collection Time: 05/17/23  2:22 PM   Specimen: Vaginal  Result Value Ref Range   Yeast Wet Prep HPF POC NONE SEEN NONE SEEN   Trich, Wet Prep NONE SEEN NONE SEEN   Clue Cells Wet Prep HPF POC NONE SEEN NONE SEEN   WBC, Wet Prep HPF POC <10 <10   Sperm NONE SEEN     Comment: Performed at [facility], [address], [place], [place] [address]     Review of Systems  Gastrointestinal:  Negative for abdominal pain.  Genitourinary:  Positive for vaginal bleeding.   Physical Exam   Blood pressure 121/82, pulse 68, temperature 98.7 F (37.1 C), temperature source Oral, resp. rate 18, height 5\' 4"  (1.626 m), weight 91.2 kg, last menstrual period 03/15/2023, SpO2 99%, unknown if currently breastfeeding.  Physical Exam Constitutional:      General: She is not in acute distress.    Appearance: Normal appearance. She is not ill-appearing, toxic-appearing or diaphoretic.  Skin:    General: Skin is warm.  Neurological:     Mental Status: She is alert and oriented to person, place, and time.  Psychiatric:  Behavior: Behavior normal.    MAU Course  Procedures  MDM  Patient was seen an evaluated from triage due to the acuity of the unit.  O positive blood type.  Patient ok with with no medical management at this time. She will wait and see what happens and make further decisions once she has f/u with OB. Reviewed patient with Dr. Su Hilt.    Assessment and Plan   A:  1. Incomplete miscarriage   2. Type O blood, Rh positive   3. Vaginal bleeding      P:  Dc home Rx: Percocet, ibuprofen Bleeding precautions Keep OB follow up Return to MAU if symptoms worsen  Daniela Siebers, Harolyn Rutherford, NP 05/17/2023 6:49 PM

## 2023-05-17 NOTE — MAU Note (Addendum)
.  Janice [name] is a 42 y.o. at [redacted]w[redacted]d here in MAU reporting: Vaginal bleeding that began around 1000 this morning. She reports she placed a pad on and reports she has filled half of her pad. Reports passing very small blood clots. Denies vaginal odors and vaginal itching. Denies pain. Denies recent IC.  Next OB appointment on 8/14. Lab worked on 8/13. Had [place] yesterday in office and blood work.   hCG in office on 7/30 was 2705.  LMP: 03/15/2023 Onset of complaint: 1000 today Pain score: Denies pain.  FHT: n/a Lab orders placed from triage: none
